# Patient Record
Sex: Female | Born: 1985 | Race: Black or African American | Hispanic: No | Smoking: Never smoker
Health system: Southern US, Community
[De-identification: ages and names within clinical notes are randomized; demographics above are authoritative.]

## PROBLEM LIST (undated history)

## (undated) DIAGNOSIS — I456 Pre-excitation syndrome: Secondary | ICD-10-CM

## (undated) DIAGNOSIS — G90A Postural orthostatic tachycardia syndrome (POTS): Secondary | ICD-10-CM

## (undated) DIAGNOSIS — I498 Other specified cardiac arrhythmias: Secondary | ICD-10-CM

## (undated) HISTORY — DX: Pre-excitation syndrome: I45.6

## (undated) HISTORY — DX: Other specified cardiac arrhythmias: I49.8

## (undated) HISTORY — DX: Postural orthostatic tachycardia syndrome (POTS): G90.A

---
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
## 2018-10-13 ENCOUNTER — Encounter: Payer: Self-pay | Admitting: *Deleted

## 2018-10-27 ENCOUNTER — Telehealth: Payer: Self-pay | Admitting: Obstetrics and Gynecology

## 2018-10-27 NOTE — Telephone Encounter (Signed)
Spoke with patient about her appointment on 9/28 @ 8:30. Patient instructed that the appointment is a telephone visit. Patient instructed to be available at her appointment time. Patient verbalized understanding.

## 2018-10-30 ENCOUNTER — Ambulatory Visit (INDEPENDENT_AMBULATORY_CARE_PROVIDER_SITE_OTHER): Payer: Self-pay | Admitting: *Deleted

## 2018-10-30 ENCOUNTER — Other Ambulatory Visit: Payer: Self-pay

## 2018-10-30 DIAGNOSIS — O9989 Other specified diseases and conditions complicating pregnancy, childbirth and the puerperium: Secondary | ICD-10-CM

## 2018-10-30 DIAGNOSIS — O0990 Supervision of high risk pregnancy, unspecified, unspecified trimester: Secondary | ICD-10-CM

## 2018-10-30 DIAGNOSIS — G90A Postural orthostatic tachycardia syndrome (POTS): Secondary | ICD-10-CM

## 2018-10-30 DIAGNOSIS — Z8659 Personal history of other mental and behavioral disorders: Secondary | ICD-10-CM | POA: Insufficient documentation

## 2018-10-30 DIAGNOSIS — I456 Pre-excitation syndrome: Secondary | ICD-10-CM | POA: Insufficient documentation

## 2018-10-30 HISTORY — DX: Supervision of high risk pregnancy, unspecified, unspecified trimester: O09.90

## 2018-10-30 NOTE — Progress Notes (Signed)
I connected with  [name] on 10/30/18 at  8:30 AM EDT by telephone and verified that I am speaking with the correct person using two identifiers.   I discussed the limitations, risks, security and privacy concerns of performing an evaluation and management service by telephone and the availability of in person appointments. I also discussed with the patient that there may be a patient responsible charge related to this service. The patient expressed understanding and agreed to proceed.  Explained I am completing her New OB Intake today. We discussed Her EDD and that it is based on  sure LMP . I reviewed her allergies, meds, OB History, Medical /Surgical history, and appropriate screenings. I explained we will send her [employer] app- app sent to her while on phone.  Explained we will send a blood pressure cuff to her once her medicaid is active- she is in process of applying- asked her to inform us when her medicaid is approved.  Explained  then we will have her take her blood pressure weekly and enter into the app. Explained she will have some visits in office and some virtually. She already has Community education officer. Reviewed appointment date/ time with her , our location and to wear mask, no visitors. Explained she will have exam, ob bloodwork, hemoglobin a1C, cbg , genetic testing if desired, pap if needed. I scheduled an [place] at 19 weeks and gave her the appointment.I explained we will change her new ob visit to be with MD instead of APP due to her history  And she will be notified.  She voices understanding.  Geoff Dacanay,RN 10/30/2018  8:31 AM

## 2018-10-31 ENCOUNTER — Telehealth: Payer: Self-pay

## 2018-11-23 ENCOUNTER — Encounter: Payer: Self-pay | Admitting: Obstetrics and Gynecology

## 2018-11-23 ENCOUNTER — Ambulatory Visit (INDEPENDENT_AMBULATORY_CARE_PROVIDER_SITE_OTHER): Payer: Medicaid Other | Admitting: Obstetrics & Gynecology

## 2018-11-23 ENCOUNTER — Other Ambulatory Visit: Payer: Self-pay

## 2018-11-23 ENCOUNTER — Encounter: Payer: Self-pay | Admitting: Obstetrics & Gynecology

## 2018-11-23 ENCOUNTER — Other Ambulatory Visit (HOSPITAL_COMMUNITY)
Admission: RE | Admit: 2018-11-23 | Discharge: 2018-11-23 | Disposition: A | Discharge status: Home / Self Care | Payer: Medicaid Other | Source: Ambulatory Visit | Attending: Obstetrics & Gynecology | Admitting: Obstetrics & Gynecology

## 2018-11-23 VITALS — BP 122/80 | HR 96 | Temp 98.3°F | Wt 201.7 lb

## 2018-11-23 DIAGNOSIS — O0911 Supervision of pregnancy with history of ectopic or molar pregnancy, first trimester: Secondary | ICD-10-CM

## 2018-11-23 DIAGNOSIS — O0990 Supervision of high risk pregnancy, unspecified, unspecified trimester: Secondary | ICD-10-CM

## 2018-11-23 DIAGNOSIS — A749 Chlamydial infection, unspecified: Secondary | ICD-10-CM

## 2018-11-23 DIAGNOSIS — Z98891 History of uterine scar from previous surgery: Secondary | ICD-10-CM

## 2018-11-23 DIAGNOSIS — Z3A12 12 weeks gestation of pregnancy: Secondary | ICD-10-CM

## 2018-11-23 DIAGNOSIS — O98812 Other maternal infectious and parasitic diseases complicating pregnancy, second trimester: Secondary | ICD-10-CM

## 2018-11-23 DIAGNOSIS — I456 Pre-excitation syndrome: Secondary | ICD-10-CM

## 2018-11-23 MED ORDER — BLOOD PRESSURE MONITORING DEVI: 1.0000 | 0 refills | Status: AC

## 2018-11-23 NOTE — Patient Instructions (Signed)

## 2018-11-23 NOTE — Progress Notes (Signed)
  Subjective:    Desiree Pearson is being seen today for her first obstetrical visit.  This is not a planned pregnancy. She is at [redacted]w[redacted]d gestation. Her obstetrical history is significant for SVT with WPW. Relationship with FOB: significant other, living together. Patient does intend to breast feed. Pregnancy history fully reviewed.  Patient reports Chest pain c/w SVT  Review of Systems:   Review of Systems  Constitutional: Negative.   HENT: Negative.   Respiratory: Negative.   Cardiovascular: Positive for chest pain.  Gastrointestinal: Negative.   Genitourinary: Negative.   Musculoskeletal: Negative.     Objective:     BP 122/80   Pulse 96   Temp 98.3 F (36.8 C)   Wt 201 lb 11.2 oz (91.5 kg)   LMP 08/25/2018 (Within Days)   BMI 36.89 kg/m  Physical Exam  Vitals reviewed. Constitutional: She is oriented to person, place, and time. She appears well-developed. No distress.  HENT:  Head: Normocephalic.  Eyes: Pupils are equal, round, and reactive to light.  Neck: Normal range of motion.  Cardiovascular: Normal rate, regular rhythm and normal heart sounds.  No murmur heard. Respiratory: Effort normal and breath sounds normal. No respiratory distress.  GI: Soft. She exhibits no mass.  Genitourinary:    Vagina normal.     No vaginal discharge.   Musculoskeletal: Normal range of motion.  Neurological: She is alert and oriented to person, place, and time.  Skin: Skin is warm and dry.  Psychiatric: She has a normal mood and affect. Her behavior is normal. Judgment and thought content normal.    Maternal Exam:  Introitus: Vagina is negative for discharge.       Assessment:    Pregnancy: G2P1 Patient Active Problem List   Diagnosis Date Noted  . History of postpartum depression, currently pregnant 10/30/2018  . Supervision of high risk pregnancy, antepartum 10/30/2018  . Wolff-Parkinson-White (WPW) syndrome   . Postural orthostatic tachycardia syndrome   . Depressive  disorder, not elsewhere classified 03/12/2009       Plan:     Initial labs drawn. Prenatal vitamins. Problem list reviewed and updated. Panorama sent Role of ultrasound in pregnancy discussed; fetal survey: ordered. Amniocentesis discussed: not indicated. Follow up in 4 weeks. 50% of 30 min visit spent on counseling and coordination of care.  Cardiology consult requested   Emeterio Reeve 11/23/2018

## 2018-11-24 LAB — OBSTETRIC PANEL, INCLUDING HIV
Antibody Screen: NEGATIVE
Basophils Absolute: 0 10*3/uL (ref 0.0–0.2)
Basos: 0 %
EOS (ABSOLUTE): 0.1 10*3/uL (ref 0.0–0.4)
Eos: 1 %
HIV Screen 4th Generation wRfx: NONREACTIVE
Hematocrit: 42.6 % (ref 34.0–46.6)
Hemoglobin: 12.7 g/dL (ref 11.1–15.9)
Hepatitis B Surface Ag: NEGATIVE
Immature Grans (Abs): 0.1 10*3/uL (ref 0.0–0.1)
Immature Granulocytes: 1 %
Lymphocytes Absolute: 1.6 10*3/uL (ref 0.7–3.1)
Lymphs: 23 %
MCH: 22.9 pg — ABNORMAL LOW (ref 26.6–33.0)
MCHC: 29.8 g/dL — ABNORMAL LOW (ref 31.5–35.7)
MCV: 77 fL — ABNORMAL LOW (ref 79–97)
Monocytes Absolute: 0.3 10*3/uL (ref 0.1–0.9)
Monocytes: 4 %
Neutrophils Absolute: 5 10*3/uL (ref 1.4–7.0)
Neutrophils: 71 %
Platelets: 157 10*3/uL (ref 150–450)
RBC: 5.54 x10E6/uL — ABNORMAL HIGH (ref 3.77–5.28)
RDW: 17.4 % — ABNORMAL HIGH (ref 11.7–15.4)
RPR Ser Ql: NONREACTIVE
Rh Factor: POSITIVE
Rubella Antibodies, IGG: 11.4 index (ref >0.99)
WBC: 7 10*3/uL (ref 3.4–10.8)

## 2018-11-24 LAB — HEMOGLOBIN A1C
Est. average glucose Bld gHb Est-mCnc: 120 mg/dL
Hgb A1c MFr Bld: 5.8 % — ABNORMAL HIGH (ref 4.8–5.6)

## 2018-11-25 LAB — CULTURE, OB URINE

## 2018-11-25 LAB — URINE CULTURE, OB REFLEX

## 2018-11-27 ENCOUNTER — Telehealth: Payer: Self-pay

## 2018-11-27 NOTE — Telephone Encounter (Addendum)
-----   Message from Woodroe Mode, MD sent at 11/27/2018 10:19 AM EDT ----- Needs an early 2 hr GTT, A1C was prediabetes range  Notified pt's results and the request for her to come in for a 2 hr testing.  I explained to the pt to not have anything to eat or drink starting midnight the night before and that she will be here for at least two hours.  Pt stated that she will be able to come in 11/30/18 for the 2hr gtt only.  [place] office notified.   Mel Almond, RN 11/27/18

## 2018-11-28 ENCOUNTER — Other Ambulatory Visit: Payer: Self-pay | Admitting: Lactation Services

## 2018-11-28 DIAGNOSIS — O0990 Supervision of high risk pregnancy, unspecified, unspecified trimester: Secondary | ICD-10-CM

## 2018-11-28 LAB — CYTOLOGY - PAP
Chlamydia: POSITIVE — AB
Comment: NEGATIVE
Comment: NEGATIVE
Comment: NORMAL
Diagnosis: NEGATIVE
High risk HPV: NEGATIVE
Neisseria Gonorrhea: NEGATIVE

## 2018-11-29 ENCOUNTER — Inpatient Hospital Stay (HOSPITAL_COMMUNITY)
Admission: AD | Admit: 2018-11-29 | Discharge: 2018-11-29 | Disposition: A | Discharge status: Home / Self Care | Payer: Medicaid Other | Attending: Family Medicine | Admitting: Family Medicine

## 2018-11-29 ENCOUNTER — Encounter: Payer: Self-pay | Admitting: *Deleted

## 2018-11-29 ENCOUNTER — Telehealth: Payer: Self-pay | Admitting: *Deleted

## 2018-11-29 ENCOUNTER — Encounter (HOSPITAL_COMMUNITY): Payer: Self-pay

## 2018-11-29 ENCOUNTER — Other Ambulatory Visit: Payer: Self-pay

## 2018-11-29 DIAGNOSIS — R109 Unspecified abdominal pain: Secondary | ICD-10-CM | POA: Diagnosis not present

## 2018-11-29 DIAGNOSIS — O26892 Other specified pregnancy related conditions, second trimester: Secondary | ICD-10-CM | POA: Insufficient documentation

## 2018-11-29 DIAGNOSIS — A749 Chlamydial infection, unspecified: Secondary | ICD-10-CM | POA: Insufficient documentation

## 2018-11-29 DIAGNOSIS — R1013 Epigastric pain: Secondary | ICD-10-CM

## 2018-11-29 DIAGNOSIS — Z8659 Personal history of other mental and behavioral disorders: Secondary | ICD-10-CM

## 2018-11-29 DIAGNOSIS — O0990 Supervision of high risk pregnancy, unspecified, unspecified trimester: Secondary | ICD-10-CM

## 2018-11-29 DIAGNOSIS — T887XXA Unspecified adverse effect of drug or medicament, initial encounter: Secondary | ICD-10-CM

## 2018-11-29 DIAGNOSIS — O26891 Other specified pregnancy related conditions, first trimester: Secondary | ICD-10-CM | POA: Diagnosis not present

## 2018-11-29 DIAGNOSIS — O98513 Other viral diseases complicating pregnancy, third trimester: Secondary | ICD-10-CM | POA: Diagnosis not present

## 2018-11-29 DIAGNOSIS — M549 Dorsalgia, unspecified: Secondary | ICD-10-CM | POA: Diagnosis not present

## 2018-11-29 DIAGNOSIS — Z79899 Other long term (current) drug therapy: Secondary | ICD-10-CM | POA: Insufficient documentation

## 2018-11-29 DIAGNOSIS — Z3A13 13 weeks gestation of pregnancy: Secondary | ICD-10-CM | POA: Insufficient documentation

## 2018-11-29 DIAGNOSIS — G90A Postural orthostatic tachycardia syndrome (POTS): Secondary | ICD-10-CM

## 2018-11-29 DIAGNOSIS — O98819 Other maternal infectious and parasitic diseases complicating pregnancy, unspecified trimester: Secondary | ICD-10-CM

## 2018-11-29 DIAGNOSIS — O99891 Other specified diseases and conditions complicating pregnancy: Secondary | ICD-10-CM

## 2018-11-29 HISTORY — DX: Other maternal infectious and parasitic diseases complicating pregnancy, unspecified trimester: A74.9

## 2018-11-29 LAB — URINALYSIS, ROUTINE W REFLEX MICROSCOPIC
Bilirubin Urine: NEGATIVE
Glucose, UA: NEGATIVE mg/dL
Hgb urine dipstick: NEGATIVE
Ketones, ur: NEGATIVE mg/dL
Nitrite: NEGATIVE
Protein, ur: NEGATIVE mg/dL
Specific Gravity, Urine: 1.011 (ref 1.005–1.030)
pH: 8 (ref 5.0–8.0)

## 2018-11-29 MED ORDER — ALUM & MAG HYDROXIDE-SIMETH 200-200-20 MG/5ML PO SUSP: 30.0000 mL | Freq: Once | ORAL | Status: DC

## 2018-11-29 MED ORDER — LIDOCAINE VISCOUS HCL 2 % MT SOLN
15.0000 mL | Freq: Once | OROMUCOSAL | Status: AC
  Administered 2018-11-29: 15 mL via ORAL
  Filled 2018-11-29: qty 15

## 2018-11-29 MED ORDER — FAMOTIDINE 20 MG PO TABS: 20.0000 mg | ORAL_TABLET | Freq: Two times a day (BID) | ORAL | 0 refills | Status: DC | PRN

## 2018-11-29 MED ORDER — ALUM & MAG HYDROXIDE-SIMETH 200-200-20 MG/5ML PO SUSP
30.0000 mL | Freq: Once | ORAL | Status: AC
  Administered 2018-11-29: 30 mL via ORAL
  Filled 2018-11-29: qty 30

## 2018-11-29 MED ORDER — AZITHROMYCIN 250 MG PO TABS: ORAL_TABLET | ORAL | 1 refills | Status: DC

## 2018-11-29 MED ORDER — DICYCLOMINE HCL 10 MG/5ML PO SOLN
10.0000 mg | Freq: Once | ORAL | Status: AC
  Administered 2018-11-29: 10 mg via ORAL
  Filled 2018-11-29: qty 5

## 2018-11-29 MED ORDER — FAMOTIDINE 20 MG PO TABS
20.0000 mg | ORAL_TABLET | Freq: Once | ORAL | Status: AC
  Administered 2018-11-29: 20 mg via ORAL
  Filled 2018-11-29: qty 1

## 2018-11-29 MED FILL — AZITHROMYCIN 250 MG TABLET: 250 | 1 days supply | Qty: 4 | Fill #0 | Status: TO

## 2018-11-29 NOTE — Telephone Encounter (Signed)
Per infectious disease report Desiree Pearson reported to test Positive for Chlamydia on her pap smear. I called Desiree Pearson and informed her of + chlamydia on her pap smear, and that her pap smear was negative. I explained this is a STD and she and her partner need to be treated and avoid sexual contact until both treated; then wait 2 weeks. I explained we will send RX for Zithromax to her pharmacy and we can send a refill for  Her partner if needed if he is not allergic to any meds. She confirmed her is not allergic and asked that I send in refill. I explained because she is pregnant we will do a TOC later to be sure infection cleared. She voices understanding. STD form completed and sent to [facility].  Forestine Macho,RN

## 2018-11-29 NOTE — MAU Provider Note (Addendum)
History   Patient Desiree Pearson is a 33 y.o. G2P1 at [redacted]w[redacted]d  Here today complaining of abdominal pain.  The patient began having abdominal pain around 6pm this evening after taking 1 gram of azithromycin orally for a chlamydia infection.  The patient said that the pain began suddenly shortly after taking the medication.  She denied nausea, vomiting, diarrhea, or vaginal bleeding.  CSN: [id number]  Arrival date and time: 11/29/18 [id number]   First Provider Initiated Contact with Patient 11/29/18 1936      Chief Complaint  Patient presents with  . Abdominal Pain  . Back Pain   Abdominal Pain This is a new problem. The current episode started today. The onset quality is sudden. The problem occurs constantly. The most recent episode lasted 2 hours. The problem has been unchanged. The pain is located in the epigastric region. The pain is at a severity of 10/10. The pain is severe. The quality of the pain is sharp. Pertinent negatives include no diarrhea, nausea or vomiting. Nothing aggravates the pain. The pain is relieved by nothing. She has tried nothing for the symptoms.    OB History    Gravida  2   Para  1   Term      Preterm      AB      Living  1     SAB      TAB      Ectopic      Multiple      Live Births  1           Past Medical History:  Diagnosis Date  . Postural orthostatic tachycardia syndrome   . Wolff-Parkinson-White (WPW) syndrome     Past Surgical History:  Procedure Laterality Date  . ABLATION  2006   for Wolf parkinson white disease  . CESAREAN SECTION      Family History  Problem Relation Age of Onset  . Diabetes Father   . Hypertension Father     Social History   Tobacco Use  . Smoking status: Never Smoker  . Smokeless tobacco: Never Used  Substance Use Topics  . Alcohol use: Not Currently    Comment: occasionally wine  . Drug use: Never    Allergies:  Allergies  Allergen Reactions  . Morphine Rash and Other (See Comments)     Heart rate dropped  . Sulfur Rash    Medications Prior to Admission  Medication Sig Dispense Refill Last Dose  . azithromycin (ZITHROMAX) 250 MG tablet Take all 4 tablets at once for at total of 1gram 4 each 1 11/29/2018 at Unknown time  . folic acid (FOLVITE) 1 MG tablet Take 1 mg by mouth daily.     . Prenatal Vit-Fe Fumarate-FA (PRENATAL VITAMINS PO) Take 1 tablet by mouth daily.   11/29/2018 at Unknown time  . Blood Pressure Monitoring DEVI 1 Device by Does not apply route once a week. 1 Device 0     Review of Systems  HENT: Positive for congestion.   Respiratory: Negative.   Cardiovascular: Negative for chest pain.  Gastrointestinal: Positive for abdominal pain. Negative for diarrhea, nausea and vomiting.  Genitourinary: Negative for vaginal bleeding.   Physical Exam   Blood pressure 128/87, pulse (!) 110, temperature 99.2 F (37.3 C), last menstrual period 08/25/2018, SpO2 100 %.  Physical Exam  Constitutional: She is oriented to person, place, and time.  HENT:  Head: Normocephalic and atraumatic.  Eyes: Pupils are equal, round, and reactive to light.  Neck: Normal range of motion.  Cardiovascular: Normal rate.  Respiratory: Effort normal.  GI: There is abdominal tenderness.  Genitourinary:    Genitourinary Comments: Deferred   Musculoskeletal: Normal range of motion.  Neurological: She is alert and oriented to person, place, and time.  Skin: Skin is warm and dry. She is not diaphoretic.  Psychiatric: Thought content normal.   FHR: 166 bpm MAU Course  Procedures  MDM GI cocktail and Bentyl  Assessment and Plan  Abdominal pain due to medication side effect.  Discharge home. Reassurance given. Return precautions given including vaginal bleeding and additional abdominal pain.  Dorothyann Peng 11/29/2018, 7:47 PM   I confirm that I have verified the information documented in the physician assistant student's note and that I have also personally  reperformed the history, physical exam and all medical decision making activities of this service and have verified that all service and findings are accurately documented in this student's note.   -Patient reassured that her baby is ok; patient thinks that her placenta is up under her ribs and I explained that her placenta is much lower, near her pubic bone.  -will order GI cocktail and Bentyl  -Patient care endorsed to Dr. Marice Potter, oncoming provider.   [place]arr [place], [place] 11/29/2018 8:03 PM   Assumed care as stated above. Patient reported mild improvement in abdominal pain but still with discomfort. Gave 20 mg Pepcid. Patient reported resolution in abdominal discomfort and felt comfortable discharging home. Pepcid prescribed as BID PRN on discharge.Patient verbalized understanding of discharge and follow-up instructions and was ambulating without assistance upon discharge.  Barrington Ellison, MD [facility] Family Medicine Fellow, [facility] for [employer], [place]

## 2018-11-29 NOTE — MAU Note (Signed)
Pt arrived via EMS for abdominal and back pain which started after taking antibiotics for chlamydia. Pt also says she has chills and a runny nose after leaving doctor yesterday. Temp 99.2  Occasional headaches.

## 2018-11-30 ENCOUNTER — Encounter: Payer: Self-pay | Admitting: Obstetrics & Gynecology

## 2018-11-30 ENCOUNTER — Other Ambulatory Visit: Payer: Medicaid Other

## 2018-11-30 MED FILL — AZITHROMYCIN 250 MG TABLET: 250 | 1 days supply | Qty: 4 | Fill #0

## 2018-12-01 ENCOUNTER — Telehealth: Payer: Self-pay | Admitting: Family Medicine

## 2018-12-01 LAB — URINE CULTURE

## 2018-12-01 NOTE — Telephone Encounter (Signed)
Called patient to get clarification on which office she will be getting ob care with because she is scheduled with [place] as well as [facility]. Patient stated that she is going to get care done with [facility] because she was not happy with the care that she received with [place] at this office. Apologized for her bad experience and her appointment was canceled with [place].

## 2018-12-04 ENCOUNTER — Telehealth: Payer: Self-pay | Admitting: Emergency Medicine

## 2018-12-04 NOTE — Telephone Encounter (Signed)
Pt called the nurse voicemail line requesting genetic screening results.   Per chart review, labs were done on 10/22 and results not available at this time.   Pt call returned and pt informed that results can take at least two weeks and at this time her results were not available. Pt verbalized understanding.

## 2018-12-05 ENCOUNTER — Encounter: Payer: Self-pay | Admitting: *Deleted

## 2018-12-07 ENCOUNTER — Encounter: Payer: Self-pay | Admitting: *Deleted

## 2018-12-22 ENCOUNTER — Telehealth: Payer: Self-pay | Admitting: General Practice

## 2018-12-22 ENCOUNTER — Encounter: Payer: Self-pay | Admitting: General Practice

## 2018-12-22 NOTE — Telephone Encounter (Signed)
Called patient regarding [facility] Results, no answer- left message to call back for results. Per chart review, patient has transferred to [facility] office.

## 2018-12-25 ENCOUNTER — Telehealth: Payer: Self-pay | Admitting: *Deleted

## 2018-12-25 NOTE — Telephone Encounter (Signed)
Received fax from [place] that a [place]  partner test is pending creation since patient has not provided partner information.  Requests we reach out to patient to submit partner information because they cannot create a test without information. Linda,RN

## 2019-01-04 ENCOUNTER — Telehealth (INDEPENDENT_AMBULATORY_CARE_PROVIDER_SITE_OTHER): Payer: BC Managed Care – PPO | Admitting: Obstetrics and Gynecology

## 2019-01-04 DIAGNOSIS — Z3A18 18 weeks gestation of pregnancy: Secondary | ICD-10-CM

## 2019-01-04 DIAGNOSIS — D563 Thalassemia minor: Secondary | ICD-10-CM | POA: Diagnosis not present

## 2019-01-04 DIAGNOSIS — I498 Other specified cardiac arrhythmias: Secondary | ICD-10-CM | POA: Diagnosis not present

## 2019-01-04 DIAGNOSIS — I456 Pre-excitation syndrome: Secondary | ICD-10-CM | POA: Diagnosis not present

## 2019-01-04 DIAGNOSIS — O99891 Other specified diseases and conditions complicating pregnancy: Secondary | ICD-10-CM

## 2019-01-04 DIAGNOSIS — O98812 Other maternal infectious and parasitic diseases complicating pregnancy, second trimester: Secondary | ICD-10-CM | POA: Diagnosis not present

## 2019-01-04 DIAGNOSIS — O0990 Supervision of high risk pregnancy, unspecified, unspecified trimester: Secondary | ICD-10-CM

## 2019-01-04 DIAGNOSIS — A749 Chlamydial infection, unspecified: Secondary | ICD-10-CM | POA: Diagnosis not present

## 2019-01-04 DIAGNOSIS — G90A Postural orthostatic tachycardia syndrome (POTS): Secondary | ICD-10-CM

## 2019-01-04 DIAGNOSIS — Z8659 Personal history of other mental and behavioral disorders: Secondary | ICD-10-CM

## 2019-01-04 DIAGNOSIS — O0992 Supervision of high risk pregnancy, unspecified, second trimester: Secondary | ICD-10-CM | POA: Diagnosis not present

## 2019-01-04 MED ORDER — MISC. DEVICES MISC: 0 refills | Status: DC

## 2019-01-04 NOTE — Progress Notes (Signed)
I connected with Desiree Pearson on 01/04/19 at 10:00 AM EST by telephone and verified that I am speaking with the correct person using two identifiers.   Pt c/o recurrent BV infections after IC.

## 2019-01-04 NOTE — Progress Notes (Signed)
   TELEHEALTH VIRTUAL OBSTETRICS VISIT ENCOUNTER NOTE  Provider location: Center for [employer] at [place]   I connected with [name] on 01/04/19 at 10:00 AM EST by telephone at home and verified that I am speaking with the correct person using two identifiers.  I discussed the limitations, risks, security and privacy concerns of performing an evaluation and management service by telephone and the availability of in person appointments. I also discussed with the patient that there may be a patient responsible charge related to this service. The patient expressed understanding and agreed to proceed.  Subjective:  Desiree Pearson is a 33 y.o. G2P1 at [redacted]w[redacted]d being followed for ongoing prenatal care.  She is currently monitored for the following issues for this high-risk pregnancy and has Depressive disorder, not elsewhere classified; History of postpartum depression, currently pregnant; Supervision of high risk pregnancy, antepartum; Wolff-Parkinson-White (WPW) syndrome; Postural orthostatic tachycardia syndrome; Chlamydia infection affecting pregnancy; and Thalassemia alpha carrier on their problem list.  Patient reports pelvic pain. Reports she is having Braxton-Hicks, feeling cramping off and on for the next few weeks. Also reports occasional pain in leg, has happened 3x in pregnancy. Reports aching in legs that sometimes is quite painful, has had to lay down when this stopped. Reports fetal movement. Denies any contractions, bleeding or leaking of fluid.   The following portions of the patient's history were reviewed and updated as appropriate: allergies, current medications, past family history, past medical history, past social history, past surgical history and problem list.   Objective:   General:  Alert, oriented and cooperative.   Mental Status: Normal mood and affect perceived. Normal judgment and thought content.  Rest of physical exam deferred due to type of encounter   Assessment and Plan:  Pregnancy: G2P1 at [redacted]w[redacted]d  1. Supervision of high risk pregnancy, antepartum Needs early 2 hr GTT Maternity belt prescribed  2. History of postpartum depression, currently pregnant  3. Chlamydia infection affecting pregnancy in second trimester Pt to come in for TOC  4. Wolff-Parkinson-White (WPW) syndrome Has episodes where she "gets out of rhythm", last episode was last week - just relaxes when this happens, does not have a cardiologist - Ambulatory referral to Cardiology  5. Postural orthostatic tachycardia syndrome  6. Thalassemia alpha carrier - AMB MFM GENETICS REFERRAL  Preterm labor symptoms and general obstetric precautions including but not limited to vaginal bleeding, contractions, leaking of fluid and fetal movement were reviewed in detail with the patient.  I discussed the assessment and treatment plan with the patient. The patient was provided an opportunity to ask questions and all were answered. The patient agreed with the plan and demonstrated an understanding of the instructions. The patient was advised to call back or seek an in-person office evaluation/go to MAU at [facility] for any urgent or concerning symptoms. Please refer to After Visit Summary for other counseling recommendations.   I provided 25 minutes of non-face-to-face time during this encounter.  Return in about 4 weeks (around 02/01/2019) for high OB, virtual.  Future Appointments  Date Time Provider [place]  01/08/2019 10:00 AM Edison [place] [facility]  01/08/2019 10:00 AM [facility] [place], [place] for [place], [place]

## 2019-01-08 ENCOUNTER — Ambulatory Visit (HOSPITAL_BASED_OUTPATIENT_CLINIC_OR_DEPARTMENT_OTHER): Payer: BC Managed Care – PPO | Admitting: Genetic Counselor

## 2019-01-08 ENCOUNTER — Other Ambulatory Visit: Payer: Self-pay

## 2019-01-08 ENCOUNTER — Ambulatory Visit (HOSPITAL_COMMUNITY)
Admission: RE | Admit: 2019-01-08 | Discharge: 2019-01-08 | Disposition: A | Discharge status: Home / Self Care | Payer: BC Managed Care – PPO | Source: Ambulatory Visit | Attending: Obstetrics and Gynecology | Admitting: Obstetrics and Gynecology

## 2019-01-08 ENCOUNTER — Ambulatory Visit (HOSPITAL_COMMUNITY): Payer: Self-pay | Admitting: Obstetrics and Gynecology

## 2019-01-08 ENCOUNTER — Other Ambulatory Visit (HOSPITAL_COMMUNITY): Payer: Self-pay | Admitting: *Deleted

## 2019-01-08 ENCOUNTER — Encounter (HOSPITAL_COMMUNITY): Payer: Self-pay

## 2019-01-08 ENCOUNTER — Ambulatory Visit (HOSPITAL_COMMUNITY): Payer: BC Managed Care – PPO | Admitting: *Deleted

## 2019-01-08 ENCOUNTER — Encounter: Payer: Medicaid Other | Admitting: Obstetrics & Gynecology

## 2019-01-08 DIAGNOSIS — Z148 Genetic carrier of other disease: Secondary | ICD-10-CM | POA: Diagnosis not present

## 2019-01-08 DIAGNOSIS — G90A Postural orthostatic tachycardia syndrome (POTS): Secondary | ICD-10-CM

## 2019-01-08 DIAGNOSIS — Z8659 Personal history of other mental and behavioral disorders: Secondary | ICD-10-CM | POA: Diagnosis not present

## 2019-01-08 DIAGNOSIS — O99891 Other specified diseases and conditions complicating pregnancy: Secondary | ICD-10-CM | POA: Diagnosis not present

## 2019-01-08 DIAGNOSIS — I498 Other specified cardiac arrhythmias: Secondary | ICD-10-CM | POA: Diagnosis not present

## 2019-01-08 DIAGNOSIS — O0990 Supervision of high risk pregnancy, unspecified, unspecified trimester: Secondary | ICD-10-CM

## 2019-01-08 DIAGNOSIS — Z3A19 19 weeks gestation of pregnancy: Secondary | ICD-10-CM

## 2019-01-08 DIAGNOSIS — O2692 Pregnancy related conditions, unspecified, second trimester: Secondary | ICD-10-CM

## 2019-01-08 DIAGNOSIS — Z362 Encounter for other antenatal screening follow-up: Secondary | ICD-10-CM

## 2019-01-08 DIAGNOSIS — D563 Thalassemia minor: Secondary | ICD-10-CM | POA: Insufficient documentation

## 2019-01-08 DIAGNOSIS — Z8279 Family history of other congenital malformations, deformations and chromosomal abnormalities: Secondary | ICD-10-CM | POA: Diagnosis not present

## 2019-01-08 DIAGNOSIS — I456 Pre-excitation syndrome: Secondary | ICD-10-CM | POA: Insufficient documentation

## 2019-01-08 DIAGNOSIS — O99212 Obesity complicating pregnancy, second trimester: Secondary | ICD-10-CM

## 2019-01-08 DIAGNOSIS — Z315 Encounter for genetic counseling: Secondary | ICD-10-CM | POA: Diagnosis not present

## 2019-01-08 NOTE — Progress Notes (Signed)
01/08/2019  Desiree Pearson 1986/01/27 MRN: [id number] DOV: 01/08/2019  Desiree Pearson presented to the [facility] for a genetics consultation regarding her carrier status for alpha-thalassemia. Desiree Pearson came to her appointment alone due to COVID-19 visitor restrictions.   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 year old female. Her current pregnancy has completed [redacted]w[redacted]d (Estimated Date of Delivery: 06/01/19).  [name] denied exposure to environmental toxins or chemical agents. She denied the use of alcohol, tobacco or street drugs. She reported taking prenatal vitamins. She denied significant viral illnesses, fevers, and bleeding during the course of her pregnancy. Her medical and surgical histories were noncontributory.  Family History  A three generation pedigree was drafted and reviewed. The family history is remarkable for the following:  - Desiree Pearson has a personal history of Wolff-Parkinson-White (WPW) syndrome treated via ablation. WPW syndrome is a condition characterized by abnormal electrical pathways in the heart that cause a disruption of the heart's normal rhythm (arrhythmia). In most cases, the cause of WPW syndrome is unknown; however, a small percentage of cases can be caused by mutations in the PRKAG2 gene. Most cases of WPW syndrome occur sporadically in individuals with no family history of the disease. These cases are not inherited. However, a small percentage of cases are familial and can be inherited in an autosomal dominant pattern. Given that no one else in [name]'s family has WPW syndrome, hers is likely not inherited and there is likely a low risk of recurrence for her children. However, given that a genetic abnormality has not been ruled out, her children could have up to a 50% risk of inheriting WPW syndrome themselves.   - Desiree Pearson's daughter from  a prior relationship and Desiree Pearson's half sister were both born with "holes in their heart". The exact etiology of this CHD is unknown. There are multifactorial causes for isolated CHDs, including environmental and genetic factors. As such, the recurrence risk for family members of individuals with a CHD is elevated over the general population incidence of 1/200 (0.5%). We discussed that without knowing the exact etiology, the risk for a second-degree relative such as a half sibling is approximately 1%. A fetal echocardiogram to assess for CHDs in the current pregnancy is recommended.  The remaining family histories were reviewed and found to be noncontributory for birth defects, intellectual disability, recurrent pregnancy loss, and known genetic conditions. Desiree Pearson had limited information about her family history due to a complicated social situation and also had limited information about her partner's family history; thus, risk assessment was limited.  The patient's ethnicity is African American. The father of the pregnancy's ethnicity is African American. Ashkenazi Jewish ancestry and consanguinity were denied. Pedigree will be scanned under Media.  Discussion  Desiree Pearson had Horizon-14 carrier screening performed through [place]. 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 Alpha-thalassemia is different in its inheritance compared to other hemoglobinopathies as there are two copies of two alpha globin genes (HBA1 and HBA2) on each chromosome 16, or four alpha globin genes total (aa/aa). A person can be a carrier of one alpha gene mutation (aa/a-), also referred to as a "silent carrier". A person who carries two alpha globin gene mutations can either carry them in cis (both on the same chromosome, denoted as aa/--) or in trans (on different chromosomes, denoted as  a-/a-). Alpha-thalassemia carriers of two mutations who have African American ancestry  are more likely to have a trans arrangement (a-/a-); cis configuration is reported to be rare in individuals with African American ancestry.     There are several different forms of alpha-thalassemia. The most severe form of alpha-thalassemia, Hb Barts, is associated with an absence of alpha globin chain synthesis as a result of deletions of all four alpha globin genes (--/--).  Given that Desiree Pearson is a carrier in trans (a-/a-), her pregnancies would not be at increased risk for Hb Barts, even if her partner is a carrier for alpha-thalassemia, as she will always pass on at least one copy of the alpha globin gene to her children. Hemoglobin H (HbH) disease is caused by three deleted or dysfunctioning alpha globin alleles (a-/--) and is characterized by microcytic hypochromic hemolytic anemia, hepatosplenomegaly, mild jaundice, growth retardation, and sometimes thalassemia-like bone changes. Given Desiree Pearson's carrier status (a-/a-), the current fetus would only be at risk for HbH disease (a-/--), if her partner is a carrier for two alpha globin mutations in cis (aa/--). If this is the case, the risk for HbH disease in the pregnancy would be 1 in 2 (50%). However, if Desiree Pearson's partner is a carrier for two alpha globin mutations, he would be more likely to carry them in trans configuration (a-/a-) than the cis configuration (aa/--), given his ethnicity. If he is a carrier of alpha-thalassemia in trans, then the pregnancy would not be at increased risk for HbH disease. Based on the carrier frequency for alpha-thalassemia in the African American population, Desiree Pearson's partner has a 1 in 30 chance of being any type of carrier for alpha-thalassemia.   Desiree Pearson carrier screening was negative for the other 13 conditions screened. Thus, her risk to be a carrier for these additional conditions (listed separately in the laboratory report) has been reduced but not eliminated. This also significantly reduces her  risk of having a child affected by one of these conditions. We discussed that carrier testing for alpha-thalassemia is recommended for Ms. Hundal's partner. Ms. Raczkowski indicated that her partner likely will not be interested in pursuing carrier screening.  We also reviewed that Ms. Bartnick had Panorama NIPS through the laboratory [facility] that was low-risk for fetal aneuploidies. We reviewed that these results showed a less than 1 in 10,000 risk for trisomies 21, 18 and 13, and monosomy X (Turner syndrome).  In addition, the risk for triploidy and sex chromosome trisomies (47,XXX and 47,XXY) was also low. Ms. Heise elected to have cfDNA analysis for 22q11.2 deletion syndrome, which was also low risk (1 in 2900). We reviewed that while this testing identifies 94-99% of pregnancies with trisomy 87, trisomy 52, sex chromosome aneuploidies, and triploidy, it is NOT diagnostic. A positive test result requires confirmation by CVS or amniocentesis, and a negative test result does not rule out a fetal chromosome abnormality. She also understands that this testing does not identify all genetic conditions.  A complete ultrasound was performed today prior to our visit. The ultrasound report will be sent under separate cover. There were no visualized fetal anomalies or markers suggestive of aneuploidy. However, views of fetal anatomy were limited.  Ms. Czerwonka was also counseled regarding diagnostic testing via amniocentesis. We discussed the technical aspects of the procedure and quoted up to a 1 in 500 (0.2%) risk for spontaneous pregnancy loss or other adverse pregnancy outcomes as a result of amniocentesis. Cultured cells from an amniocentesis sample allow  for the visualization of a fetal karyotype, which can detect >99% of chromosomal aberrations. Chromosomal microarray can also be performed to identify smaller deletions or duplications of fetal chromosomal material. Amniocentesis could also be performed to assess  whether the baby is affected by alpha-thalassemia. After careful consideration, Ms. Milo declined amniocentesis at this time. She understands that amniocentesis is available at any point after 16 weeks of pregnancy and that she may opt to undergo the procedure at a later date should she change her mind.  Lastly, screening for open neural tube defects (ONTDs) via MS-AFP in the second trimester in addition to level II ultrasound examination is recommended. Ms. Burningham level II ultrasound did not detect any ONTDs today, though she will return for follow-up views of fetal anatomy in four weeks. Level II ultrasound is able to detect ONTDs with 90-95% sensitivity. However, normal results from any of the above options do not guarantee a normal baby, as 3-5% of newborns have some type of birth defect, many of which are not prenatally diagnosable.  Additional screening and diagnostic testing were declined today. I encouraged Ms. Dales to discuss carrier screening with her partner and contact me if he changes his mind about carrier screening. If he decides to pursue testing, I can facilitate sample collection and testing from there.   I counseled Ms. Rathel regarding the above risks and available options. The approximate face-to-face time with the genetic counselor was 25 minutes.  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 She does not think her partner will undergo testing. She will contact me to facilitate testing should he change his mind  Reviewed low-risk NIPS results  Reduction in risk for Down syndrome, trisomy 81, trisomy 52, sex chromosome aneuploidies, and 22q11.2 deletion syndrome  Reviewed results of ultrasound  No fetal anomalies or markers seen  Reduction in risk for fetal aneuploidy  Offered additional testing and screening  Declined  amniocentesis  Recommend MS-AFP screening  Reviewed family history concerns  Recommend fetal echocardiogram given family history of congenital heart defects    Buelah Manis, MS Genetic Counselor

## 2019-01-09 ENCOUNTER — Other Ambulatory Visit: Payer: Medicaid Other

## 2019-01-09 ENCOUNTER — Other Ambulatory Visit (HOSPITAL_COMMUNITY)
Admission: RE | Admit: 2019-01-09 | Discharge: 2019-01-09 | Disposition: A | Discharge status: Home / Self Care | Payer: BC Managed Care – PPO | Source: Ambulatory Visit | Attending: Obstetrics and Gynecology | Admitting: Obstetrics and Gynecology

## 2019-01-09 ENCOUNTER — Ambulatory Visit (INDEPENDENT_AMBULATORY_CARE_PROVIDER_SITE_OTHER): Payer: Medicaid Other

## 2019-01-09 DIAGNOSIS — O98812 Other maternal infectious and parasitic diseases complicating pregnancy, second trimester: Secondary | ICD-10-CM

## 2019-01-09 DIAGNOSIS — Z3402 Encounter for supervision of normal first pregnancy, second trimester: Secondary | ICD-10-CM

## 2019-01-09 DIAGNOSIS — A749 Chlamydial infection, unspecified: Secondary | ICD-10-CM | POA: Insufficient documentation

## 2019-01-09 NOTE — Progress Notes (Signed)
Pt is here today for self-swab, TOC Chlamydia.

## 2019-01-10 ENCOUNTER — Other Ambulatory Visit: Payer: Self-pay

## 2019-01-10 DIAGNOSIS — O99891 Other specified diseases and conditions complicating pregnancy: Secondary | ICD-10-CM

## 2019-01-10 DIAGNOSIS — M549 Dorsalgia, unspecified: Secondary | ICD-10-CM

## 2019-01-10 DIAGNOSIS — N760 Acute vaginitis: Secondary | ICD-10-CM

## 2019-01-10 LAB — GLUCOSE TOLERANCE, 2 HOURS W/ 1HR
Glucose, 1 hour: 138 mg/dL (ref 65–179)
Glucose, 2 hour: 86 mg/dL (ref 65–152)
Glucose, Fasting: 76 mg/dL (ref 65–91)

## 2019-01-10 LAB — CBC
Hematocrit: 37.5 % (ref 34.0–46.6)
Hemoglobin: 11.5 g/dL (ref 11.1–15.9)
MCH: 22.9 pg — ABNORMAL LOW (ref 26.6–33.0)
MCHC: 30.7 g/dL — ABNORMAL LOW (ref 31.5–35.7)
MCV: 75 fL — ABNORMAL LOW (ref 79–97)
Platelets: 158 10*3/uL (ref 150–450)
RBC: 5.02 x10E6/uL (ref 3.77–5.28)
RDW: 16.6 % — ABNORMAL HIGH (ref 11.7–15.4)
WBC: 5.9 10*3/uL (ref 3.4–10.8)

## 2019-01-10 LAB — RPR: RPR Ser Ql: NONREACTIVE

## 2019-01-10 LAB — CERVICOVAGINAL ANCILLARY ONLY
Chlamydia: NEGATIVE
Comment: NEGATIVE
Comment: NORMAL
Neisseria Gonorrhea: NEGATIVE

## 2019-01-10 LAB — HIV ANTIBODY (ROUTINE TESTING W REFLEX): HIV Screen 4th Generation wRfx: NONREACTIVE

## 2019-01-10 MED ORDER — TERCONAZOLE 0.8 % VA CREA: 1.0000 | TOPICAL_CREAM | Freq: Every day | VAGINAL | 0 refills | Status: DC

## 2019-01-10 MED ORDER — MISC. DEVICES MISC: 0 refills | Status: DC

## 2019-01-10 NOTE — Progress Notes (Signed)
Terconazole ordered for pt per protocol for vaginal itching and discharge. Maternity support belt also ordered for patient. Pt made aware.

## 2019-01-12 NOTE — Progress Notes (Signed)
Desiree Lo MD Reason for referral-history of WPW and POTS  HPI: 33 year old female for evaluation of WPW and POTS at request of Desiree Rota, MD. Patient has had previous ablation in 2006.  I do not have those records available.  She notes some dyspnea on exertion.  She has palpitations described as heart racing intermittently.  This is typically twice monthly.  There is associated dizziness but no syncope.  She has occasional chest pain for less than 1 minute.  Increases with inspiration and described as a sharp pain.  She has had this intermittently since prior to her ablation.  Because of the above cardiology asked to evaluate.  Current Outpatient Medications  Medication Sig Dispense Refill  . Blood Pressure Monitoring DEVI 1 Device by Does not apply route once a week. 1 Device 0  . folic acid (FOLVITE) 1 MG tablet Take 1 mg by mouth daily.    . Misc. Devices MISC Dispense one maternity belt for patient 1 each 0  . Prenatal Vit-Fe Fumarate-FA (PRENATAL VITAMINS PO) Take 1 tablet by mouth daily.     No current facility-administered medications for this visit.    Allergies  Allergen Reactions  . Morphine Rash and Other (See Comments)    Heart rate dropped  . Sulfur Rash     Past Medical History:  Diagnosis Date  . Postural orthostatic tachycardia syndrome   . Wolff-Parkinson-White (WPW) syndrome     Past Surgical History:  Procedure Laterality Date  . ABLATION  2006   for Wolf parkinson white disease  . CESAREAN SECTION      Social History   Socioeconomic History  . Marital status: Legally Separated    Spouse name: Not on file  . Number of children: 1  . Years of education: Not on file  . Highest education level: Not on file  Occupational History  . Not on file  Tobacco Use  . Smoking status: Never Smoker  . Smokeless tobacco: Never Used  Substance and Sexual Activity  . Alcohol use: Not Currently    Comment: occasionally wine  . Drug use: Never    . Sexual activity: Yes    Birth control/protection: None  Other Topics Concern  . Not on file  Social History Narrative  . Not on file   Social Determinants of Health   Financial Resource Strain:   . Difficulty of Paying Living Expenses: Not on file  Food Insecurity: No Food Insecurity  . Worried About Charity fundraiser in the Last Year: Never true  . Ran Out of Food in the Last Year: Never true  Transportation Needs: No Transportation Needs  . Lack of Transportation (Medical): No  . Lack of Transportation (Non-Medical): No  Physical Activity:   . Days of Exercise per Week: Not on file  . Minutes of Exercise per Session: Not on file  Stress:   . Feeling of Stress : Not on file  Social Connections:   . Frequency of Communication with Friends and Family: Not on file  . Frequency of Social Gatherings with Friends and Family: Not on file  . Attends Religious Services: Not on file  . Active Member of Clubs or Organizations: Not on file  . Attends Archivist Meetings: Not on file  . Marital Status: Not on file  Intimate Partner Violence:   . Fear of Current or Ex-Partner: Not on file  . Emotionally Abused: Not on file  . Physically Abused: Not on file  .  Sexually Abused: Not on file    Family History  Problem Relation Age of Onset  . Diabetes Father   . Hypertension Father     ROS: no fevers or chills, productive cough, hemoptysis, dysphasia, odynophagia, melena, hematochezia, dysuria, hematuria, rash, seizure activity, orthopnea, PND, pedal edema, claudication. Remaining systems are negative.  Physical Exam:   Blood pressure 112/82, pulse (!) 119, height 5\' 2"  (1.575 m), weight 203 lb 6.4 oz (92.3 kg), last menstrual period 08/25/2018.  General:  Well developed/well nourished in NAD Skin warm/dry Patient not depressed No peripheral clubbing Back-normal HEENT-normal/normal eyelids Neck supple/normal carotid upstroke bilaterally; no bruits; no JVD; no  thyromegaly chest - CTA/ normal expansion CV - RRR/normal S1 and S2; no murmurs, rubs or gallops;  PMI nondisplaced Abdomen -NT/ND, no HSM, 56-month intrauterine pregnancy 2+ femoral pulses, no bruits Ext-no edema, chords, 2+ DP Neuro-grossly nonfocal  ECG -sinus tachycardia at a rate at 119, no ST changes.  Personally reviewed  A/P  1 palpitations-etiology unclear.  She apparently has worn monitors previously that have been unremarkable.  She does have a history of supraventricular tachycardia status post ablation.  I will arrange a monitor to further assess.  We can consider referral to electrophysiology following delivery if her symptoms persists and SVT documented.  We will arrange echocardiogram to assess LV function.  2 WPW-status post prior ablation.  3 chest pain-symptoms are atypical.  Electrocardiogram shows no ST changes.  Can consider cardiac CTA in the future when she delivers.  4 history of pots  Desiree Ruths, MD

## 2019-01-18 ENCOUNTER — Ambulatory Visit (INDEPENDENT_AMBULATORY_CARE_PROVIDER_SITE_OTHER): Payer: BC Managed Care – PPO | Admitting: Cardiology

## 2019-01-18 ENCOUNTER — Inpatient Hospital Stay (HOSPITAL_COMMUNITY)
Admission: AD | Admit: 2019-01-18 | Discharge: 2019-01-18 | Disposition: A | Discharge status: Home / Self Care | Payer: BC Managed Care – PPO | Attending: Obstetrics & Gynecology | Admitting: Obstetrics & Gynecology

## 2019-01-18 ENCOUNTER — Other Ambulatory Visit: Payer: Self-pay

## 2019-01-18 ENCOUNTER — Encounter: Payer: Self-pay | Admitting: Cardiology

## 2019-01-18 ENCOUNTER — Encounter (HOSPITAL_COMMUNITY): Payer: Self-pay | Admitting: Obstetrics & Gynecology

## 2019-01-18 VITALS — BP 112/82 | HR 119 | Ht 62.0 in | Wt 203.4 lb

## 2019-01-18 DIAGNOSIS — R102 Pelvic and perineal pain unspecified side: Secondary | ICD-10-CM

## 2019-01-18 DIAGNOSIS — R002 Palpitations: Secondary | ICD-10-CM

## 2019-01-18 DIAGNOSIS — R309 Painful micturition, unspecified: Secondary | ICD-10-CM | POA: Diagnosis not present

## 2019-01-18 DIAGNOSIS — I498 Other specified cardiac arrhythmias: Secondary | ICD-10-CM

## 2019-01-18 DIAGNOSIS — R072 Precordial pain: Secondary | ICD-10-CM

## 2019-01-18 DIAGNOSIS — O26899 Other specified pregnancy related conditions, unspecified trimester: Secondary | ICD-10-CM

## 2019-01-18 DIAGNOSIS — Z3A20 20 weeks gestation of pregnancy: Secondary | ICD-10-CM | POA: Diagnosis not present

## 2019-01-18 DIAGNOSIS — G90A Postural orthostatic tachycardia syndrome (POTS): Secondary | ICD-10-CM

## 2019-01-18 DIAGNOSIS — I456 Pre-excitation syndrome: Secondary | ICD-10-CM

## 2019-01-18 DIAGNOSIS — O26892 Other specified pregnancy related conditions, second trimester: Secondary | ICD-10-CM

## 2019-01-18 LAB — URINALYSIS, ROUTINE W REFLEX MICROSCOPIC
Bilirubin Urine: NEGATIVE
Glucose, UA: NEGATIVE mg/dL
Hgb urine dipstick: NEGATIVE
Ketones, ur: NEGATIVE mg/dL
Nitrite: NEGATIVE
Protein, ur: NEGATIVE mg/dL
Specific Gravity, Urine: 1.01 (ref 1.005–1.030)
pH: 7 (ref 5.0–8.0)

## 2019-01-18 NOTE — MAU Note (Signed)
.   Desiree Pearson is a 33 y.o. at [redacted]w[redacted]d here in MAU reporting:pelvic pressure since yesterday. States she feels more pressure when she voids. Denies any VB  Onset of complaint: yesterday Pain score: 10 Vitals:   01/18/19 1445 01/18/19 1448  BP:  123/76  Pulse: 100 92  Resp: 18 16  Temp: 98.1 F (36.7 C) 98 F (36.7 C)  SpO2: 100%      FHT:135 Lab orders placed from triage: UA

## 2019-01-18 NOTE — MAU Provider Note (Signed)
CC:  Chief Complaint  Patient presents with  . vaginal pressure      First Provider Initiated Contact with Patient 01/18/19 2201     HPI: Desiree Pearson is a 33 y.o. year old G11P1 female at [redacted]w[redacted]d weeks gestation who presents to MAU reporting pelvic pressure since yesterday that is worse when she voids.    Associated Sx: Neg for hematuria, urgency, frequency, fever, chills, GI complaints, vaginal discharge  Vaginal bleeding: denies Leaking of fluid: denies Fetal movement: Active  O:  Patient Vitals for the past 24 hrs:  BP Temp Pulse Resp SpO2 Weight  01/18/19 1448 123/76 98 F (36.7 C) 92 16 -- 92 kg  01/18/19 1445 -- 98.1 F (36.7 C) 100 18 100 % --    General: NAD Heart: Regular rate Lungs: Normal rate and effort Abd: Soft, NT, Gravid, S=D Pelvic: NEFG, neg LOF, neg  blood.  Dilation: Closed Effacement (%): Thick Exam by:: Marlou Porch, CNM  FHR 135 by doppler  Results for orders placed or performed during the hospital encounter of 01/18/19 (from the past 24 hour(s))  Urinalysis, Routine w reflex microscopic     Status: Abnormal   Collection Time: 01/18/19  3:10 PM  Result Value Ref Range   Color, Urine YELLOW YELLOW   APPearance HAZY (A) CLEAR   Specific Gravity, Urine 1.010 1.005 - 1.030   pH 7.0 5.0 - 8.0   Glucose, UA NEGATIVE NEGATIVE mg/dL   Hgb urine dipstick NEGATIVE NEGATIVE   Bilirubin Urine NEGATIVE NEGATIVE   Ketones, ur NEGATIVE NEGATIVE mg/dL   Protein, ur NEGATIVE NEGATIVE mg/dL   Nitrite NEGATIVE NEGATIVE   Leukocytes,Ua MODERATE (A) NEGATIVE   RBC / HPF 0-5 0 - 5 RBC/hpf   WBC, UA 6-10 0 - 5 WBC/hpf   Bacteria, UA RARE (A) NONE SEEN   Squamous Epithelial / LPF 6-10 0 - 5     Orders Placed This Encounter  Procedures  . Culture, OB Urine  . Urinalysis, Routine w reflex microscopic   MAU course/MDM Pelvic pressure consistent with round ligament pain. UA + Leuks, otherwise neg. Not clearly indicative of UTI.  Urine sent for culture.  Offered Rx  antibiotic now or wait for urine culture result.  Patient prefers to wait for urine culture result.  No evidence of active preterm labor.  A: [redacted]w[redacted]d week IUP 1. Pelvic pain affecting pregnancy in second trimester, antepartum   2. Pain of round ligament affecting pregnancy, antepartum   3. Painful urination    P: Discharge home in stable condition Preterm labor precautions and fetal kick counts. Increase fluids. Tylenol as needed. Follow-up as scheduled for prenatal visit or sooner as needed if symptoms worsen. Return to maternity admissions as needed if symptoms worsen.  Tamala Julian, [place], [place] 01/18/2019 5:46 PM  3

## 2019-01-18 NOTE — Discharge Instructions (Signed)
Abdominal Pain During Pregnancy  Abdominal pain is common during pregnancy, and has many possible causes. Some causes are more serious than others, and sometimes the cause is not known. Abdominal pain can be a sign that labor is starting. It can also be caused by normal growth and stretching of muscles and ligaments during pregnancy. Always tell your health care provider if you have any abdominal pain. Follow these instructions at home:  Do not have sex or put anything in your vagina until your pain goes away completely.  Get plenty of rest until your pain improves.  Drink enough fluid to keep your urine pale yellow.  Take over-the-counter and prescription medicines only as told by your health care provider.  Keep all follow-up visits as told by your health care provider. This is important. Contact a health care provider if:  Your pain continues or gets worse after resting.  You have lower abdominal pain that: ? Comes and goes at regular intervals. ? Spreads to your back. ? Is similar to menstrual cramps.  You have pain or burning when you urinate. Get help right away if:  You have a fever or chills.  You have vaginal bleeding.  You are leaking fluid from your vagina.  You are passing tissue from your vagina.  You have vomiting or diarrhea that lasts for more than 24 hours.  Your baby is moving less than usual.  You feel very weak or faint.  You have shortness of breath.  You develop severe pain in your upper abdomen. Summary  Abdominal pain is common during pregnancy, and has many possible causes.  If you experience abdominal pain during pregnancy, tell your health care provider right away.  Follow your health care provider's home care instructions and keep all follow-up visits as directed. This information is not intended to replace advice given to you by your health care provider. Make sure you discuss any questions you have with your health care  provider. Document Released: 01/18/2005 Document Revised: 05/08/2018 Document Reviewed: 04/22/2016 Elsevier Patient Education  [place].  Round Ligament Pain During Pregnancy   Round ligament pain is a sharp pain or jabbing feeling often felt in the lower belly or groin area on one or both sides. It is one of the most common complaints during pregnancy and is considered a normal part of pregnancy. It is most often felt during the second trimester.   Here is what you need to know about round ligament pain, including some tips to help you feel better.   Causes of Round Ligament Pain   Several thick ligaments surround and support your womb (uterus) as it grows during pregnancy. One of them is called the round ligament.   The round ligament connects the front part of the womb to your groin, the area where your legs attach to your pelvis. The round ligament normally tightens and relaxes slowly.   As your baby and womb grow, the round ligament stretches. That makes it more likely to become strained.   Sudden movements can cause the ligament to tighten quickly, like a rubber band snapping. This causes a sudden and quick jabbing feeling.   Symptoms of Round Ligament Pain   Round ligament pain can be concerning and uncomfortable. But it is considered normal as your body changes during pregnancy.   The symptoms of round ligament pain include a sharp, sudden spasm in the belly. It usually affects the right side, but it may happen on both sides. The pain only  lasts a few seconds.   Exercise may cause the pain, as will rapid movements such as:  sneezing  coughing  laughing  rolling over in bed  standing up too quickly   Treatment of Round Ligament Pain   Here are some tips that may help reduce your discomfort:   Pain relief. Take over-the-counter acetaminophen for pain, if necessary. Ask your doctor if this is OK.   Exercise. Get plenty of exercise to keep your stomach (core)  muscles strong. Doing stretching exercises or prenatal yoga can be helpful. Ask your doctor which exercises are safe for you and your baby.   A helpful exercise involves putting your hands and knees on the floor, lowering your head, and pushing your backside into the air.   Avoid sudden movements. Change positions slowly (such as standing up or sitting down) to avoid sudden movements that may cause stretching and pain.   Flex your hips. Bend and flex your hips before you cough, sneeze, or laugh to avoid pulling on the ligaments.   Apply warmth. A heating pad or warm bath may be helpful. Ask your doctor if this is OK. Extreme heat can be dangerous to the baby.   You should try to modify your daily activity level and avoid positions that may worsen the condition.   When to Call the Doctor/Midwife   Always tell your doctor or midwife about any type of pain you have during pregnancy. Round ligament pain is quick and doesn't last long.   Call your health care provider immediately if you have:  severe pain  fever  chills  pain on urination  difficulty walking   Belly pain during pregnancy can be due to many different causes. It is important for your doctor to rule out more serious conditions, including pregnancy complications such as placenta abruption or non-pregnancy illnesses such as:  inguinal hernia  appendicitis  stomach, liver, and kidney problems  Preterm labor pains may sometimes be mistaken for round ligament pain.

## 2019-01-18 NOTE — Patient Instructions (Signed)
Medication Instructions:  NO CHANGE *If you need a refill on your cardiac medications before your next appointment, please call your pharmacy*  Lab Work: If you have labs (blood work) drawn today and your tests are completely normal, you will receive your results only by: Marland Kitchen MyChart Message (if you have MyChart) OR . A paper copy in the mail If you have any lab test that is abnormal or we need to change your treatment, we will call you to review the results.  Testing/Procedures: Your physician has requested that you have an echocardiogram. Echocardiography is a painless test that uses sound waves to create images of your heart. It provides your doctor with information about the size and shape of your heart and how well your heart's chambers and valves are working. This procedure takes approximately one hour. There are no restrictions for this procedure.[place] has recommended that you wear a 30 DAY event monitor. Event monitors are medical devices that record the heart's electrical activity. Doctors most often [place] these monitors to diagnose arrhythmias. Arrhythmias are problems with the speed or rhythm of the heartbeat. The monitor is a small, portable device. You can wear one while you do your normal daily activities. This is usually used to diagnose what is causing palpitations/syncope (passing out).      Follow-Up: At [facility], you and your health needs are our priority.  As part of our continuing mission to provide you with exceptional heart care, we have created designated Provider Care Teams.  These Care Teams include your primary Cardiologist (physician) and Advanced Practice Providers (APPs -  Physician Assistants and Nurse Practitioners) who all work together to provide you with the care you need, when you need it.  Your next appointment:   6 month(s)  The format for your next appointment:   Either In Person or Virtual  Provider:   You may see  [name] MD or one of the following Advanced Practice Providers on your designated Care Team:    Kerin Ransom, PA-C  [place], [place]  Coletta Memos, [place]

## 2019-01-20 LAB — CULTURE, OB URINE: Culture: 30000 — AB

## 2019-01-22 ENCOUNTER — Other Ambulatory Visit: Payer: Self-pay | Admitting: Advanced Practice Midwife

## 2019-01-22 DIAGNOSIS — R300 Dysuria: Secondary | ICD-10-CM

## 2019-01-22 DIAGNOSIS — O26892 Other specified pregnancy related conditions, second trimester: Secondary | ICD-10-CM

## 2019-01-23 ENCOUNTER — Other Ambulatory Visit: Payer: Self-pay

## 2019-01-23 ENCOUNTER — Other Ambulatory Visit: Payer: Medicaid Other

## 2019-01-23 DIAGNOSIS — O0990 Supervision of high risk pregnancy, unspecified, unspecified trimester: Secondary | ICD-10-CM

## 2019-01-25 LAB — URINE CULTURE, OB REFLEX

## 2019-01-25 LAB — CULTURE, OB URINE

## 2019-01-30 ENCOUNTER — Other Ambulatory Visit: Payer: Self-pay

## 2019-01-30 ENCOUNTER — Ambulatory Visit (HOSPITAL_COMMUNITY): Payer: BC Managed Care – PPO | Attending: Cardiovascular Disease

## 2019-01-30 DIAGNOSIS — R002 Palpitations: Secondary | ICD-10-CM | POA: Diagnosis not present

## 2019-01-30 DIAGNOSIS — R072 Precordial pain: Secondary | ICD-10-CM | POA: Insufficient documentation

## 2019-01-31 ENCOUNTER — Ambulatory Visit (INDEPENDENT_AMBULATORY_CARE_PROVIDER_SITE_OTHER): Payer: BC Managed Care – PPO

## 2019-01-31 DIAGNOSIS — R002 Palpitations: Secondary | ICD-10-CM | POA: Diagnosis not present

## 2019-02-01 ENCOUNTER — Telehealth (INDEPENDENT_AMBULATORY_CARE_PROVIDER_SITE_OTHER): Payer: BC Managed Care – PPO | Admitting: Obstetrics and Gynecology

## 2019-02-01 ENCOUNTER — Encounter: Payer: Self-pay | Admitting: Obstetrics and Gynecology

## 2019-02-01 DIAGNOSIS — O0992 Supervision of high risk pregnancy, unspecified, second trimester: Secondary | ICD-10-CM

## 2019-02-01 DIAGNOSIS — Z3A22 22 weeks gestation of pregnancy: Secondary | ICD-10-CM

## 2019-02-01 DIAGNOSIS — O0990 Supervision of high risk pregnancy, unspecified, unspecified trimester: Secondary | ICD-10-CM

## 2019-02-01 DIAGNOSIS — O99891 Other specified diseases and conditions complicating pregnancy: Secondary | ICD-10-CM | POA: Diagnosis not present

## 2019-02-01 DIAGNOSIS — G90A Postural orthostatic tachycardia syndrome (POTS): Secondary | ICD-10-CM

## 2019-02-01 DIAGNOSIS — Z8659 Personal history of other mental and behavioral disorders: Secondary | ICD-10-CM

## 2019-02-01 DIAGNOSIS — I498 Other specified cardiac arrhythmias: Secondary | ICD-10-CM

## 2019-02-01 NOTE — Progress Notes (Signed)
TELEHEALTH OBSTETRICS PRENATAL VIRTUAL VIDEO VISIT ENCOUNTER NOTE  Provider location: [facility] at [place]   I connected with Desiree Pearson on 02/01/19 at  9:30 AM EST by MyChart Video Encounter at home and verified that I am speaking with the correct person using two identifiers.   I discussed the limitations, risks, security and privacy concerns of performing an evaluation and management service virtually and the availability of in person appointments. I also discussed with the patient that there may be a patient responsible charge related to this service. The patient expressed understanding and agreed to proceed. Subjective:  Desiree Pearson is a 33 y.o. G2P1 at [redacted]w[redacted]d being seen today for ongoing prenatal care.  She is currently monitored for the following issues for this low-risk pregnancy and has Depressive disorder, not elsewhere classified; History of postpartum depression, currently pregnant; Supervision of high risk pregnancy, antepartum; Wolff-Parkinson-White (WPW) syndrome; Postural orthostatic tachycardia syndrome; Chlamydia infection affecting pregnancy; and Thalassemia alpha carrier on their problem list.  Patient reports no complaints.  Contractions: Not present. Vag. Bleeding: None.  Movement: Present. Denies any leaking of fluid.   The following portions of the patient's history were reviewed and updated as appropriate: allergies, current medications, past family history, past medical history, past social history, past surgical history and problem list.   Objective:  There were no vitals filed for this visit.  Fetal Status:     Movement: Present     General:  Alert, oriented and cooperative. Patient is in no acute distress.  Respiratory: Normal respiratory effort, no problems with respiration noted  Mental Status: Normal mood and affect. Normal behavior. Normal judgment and thought content.  Rest of physical exam deferred due to type of  encounter  Imaging: [place] MFM OB DETAIL +14 WK  Result Date: 01/08/2019 ----------------------------------------------------------------------  OBSTETRICS REPORT                       (Signed Final 01/08/2019 11:12 am) ---------------------------------------------------------------------- Patient Info  ID #:       [id number]                          D.O.B.:  06/28/1985 (33 yrs)  Name:       Desiree Pearson                     Visit Date: 01/08/2019 10:19 am ---------------------------------------------------------------------- Performed By  Performed By:     Berlinda Last          Ref. Address:     [address] [place],                                                              [address]  Attending:  Johnell Comings MD         [facility]  Referred By:      Lezlie Lye NP ---------------------------------------------------------------------- Orders   #  Description                          Code         Ordered By   1  [place] MFM OB DETAIL +14 WK              76811.01     [facility]  ----------------------------------------------------------------------   #  Order #                    Accession #                 Episode #   1  [id number]                  [id number]                  [id number]  ---------------------------------------------------------------------- Indications   Obesity complicating pregnancy, second         O99.212   trimester (prepreg BMI 123456)   Medical complication of pregnancy (WPW         O26.90   syndrome, POTS)   Family history of congenital anomaly (heart    Z82.79   defects)   Encounter for antenatal screening for          Z36.3   malformations (low risk NIPS)   [redacted] weeks gestation of pregnancy                Z3A.19   Genetic carrier (alpha thal.)                  Z14.8   ---------------------------------------------------------------------- Fetal Evaluation  Num Of Fetuses:         1  Fetal Heart Rate(bpm):  135  Cardiac Activity:       Observed  Presentation:           Cephalic  Placenta:               Posterior  P. Cord Insertion:      Visualized  Amniotic Fluid  AFI FV:      Within normal limits ---------------------------------------------------------------------- Biometry  BPD:      42.8  mm     G. Age:  19w 0d         30  %    CI:        71.03   %    70 - 86  FL/HC:      18.4   %    16.1 - 18.3  HC:      161.8  mm     G. Age:  19w 0d         22  %    HC/AC:      1.07        1.09 - 1.39  AC:      151.2  mm     G. Age:  20w 2d         75  %    FL/BPD:     69.6   %  FL:       29.8  mm     G. Age:  19w 1d         34  %    FL/AC:      19.7   %    20 - 24  HUM:      28.3  mm     G. Age:  19w 1d         44  %  CER:      21.4  mm     G. Age:  20w 3d         60  %  Est. FW:     308  gm    0 lb 11 oz      62  % ---------------------------------------------------------------------- OB History  Gravidity:    2         Term:   1        Prem:   0        SAB:   0  TOP:          0       Ectopic:  0        Living: 1 ---------------------------------------------------------------------- Gestational Age  LMP:           19w 3d        Date:  08/25/18                 EDD:   06/01/19  U/S Today:     19w 3d                                        EDD:   06/01/19  Best:          19w 3d     Det. By:  LMP  (08/25/18)          EDD:   06/01/19 ---------------------------------------------------------------------- Anatomy  Cranium:               Appears normal         Aortic Arch:            Not well visualized  Cavum:                 Appears normal         Ductal Arch:            Not well visualized  Ventricles:            Appears normal         Diaphragm:              Appears normal  Choroid Plexus:        Appears normal         Stomach:  Appears normal, left                                                                        sided  Cerebellum:            Appears normal         Abdomen:                Appears normal  Posterior Fossa:       Appears normal         Abdominal Wall:         Appears nml (cord                                                                        insert, abd wall)  Nuchal Fold:           Appears normal         Cord Vessels:           Appears normal (3                                                                        vessel cord)  Face:                  Orbits nl; profile not Kidneys:                Appear normal                         well visualized  Lips:                  Appears normal         Bladder:                Appears normal  Thoracic:              Appears normal         Spine:                  Appears normal  Heart:                 Appears normal         Upper Extremities:      Appears normal                         (4CH, axis, and                         situs)  RVOT:  Appears normal         Lower Extremities:      Appears normal  LVOT:                  Appears normal  Other:  Female gender Heels visualized. Hands not well visualized.          Technically difficult due to maternal habitus and fetal position. ---------------------------------------------------------------------- Cervix Uterus Adnexa  Cervix  Length:            3.9  cm.  Normal appearance by transabdominal scan. ---------------------------------------------------------------------- Comments  This patient was seen for a detailed fetal anatomy scan due  to maternal obesity.  The patient reports that her prior child  was born with a hole in her heart.  Her sister also has a  history of a VSD.  She denies any other significant past medical history and  denies any problems in her current pregnancy.  She had a cell free DNA test earlier in her pregnancy which  indicated a low risk for trisomy 59, 75, and 13. A female fetus  is  predicted.  She was informed that the fetal growth and amniotic fluid  level were appropriate for her gestational age.  Due to the fetal position and maternal body habitus, the views  of the fetal anatomy were limited today.  The patient was informed that anomalies may be missed due  to technical limitations. If the fetus is in a suboptimal position  or maternal habitus is increased, visualization of the fetus in  the maternal uterus may be impaired.  Due to the significant family history of VSDs, the patient was  referred for a fetal echocardiogram with [facility].  A follow-up exam was scheduled in 4 weeks to obtain better  views of the fetal anatomy. ----------------------------------------------------------------------                   Johnell Comings, MD Electronically Signed Final Report   01/08/2019 11:12 am ----------------------------------------------------------------------  ECHOCARDIOGRAM COMPLETE  Result Date: 01/30/2019   ECHOCARDIOGRAM REPORT   Patient Name:   Desiree Pearson   Date of Exam: 01/30/2019 Medical Rec #:  [id number]     Height:       62.0 in Accession #:    [id number]    Weight:       202.8 lb Date of Birth:  [birth year]      BSA:          1.92 m Patient Age:    33 years      BP:           112/82 mmHg Patient Gender: F             HR:           90 bpm. Exam Location:  [place] Procedure: 2D Echo, Cardiac Doppler and Color Doppler Indications:    R07.2 Precordial Pain                 R00.2 Palpitations  History:        Patient has no prior history of Echocardiogram examinations.                 Signs/Symptoms:Dyspnea and Dizziness/Lightheadedness. Pregnancy                 (22 weeks), POTS, Wolfe-Parkinson-White Syndrome status post                 Ablation (2006).  Sonographer:  Deliah Boston RDCS Referring Phys: [place]  1. Left ventricular ejection fraction, by visual estimation, is 60 to 65%. The left ventricle has normal function. There  is no left ventricular hypertrophy.  2. The left ventricle has no regional wall motion abnormalities.  3. Global right ventricle has normal systolic function.The right ventricular size is normal. No increase in right ventricular wall thickness.  4. Left atrial size was normal.  5. Right atrial size was normal.  6. Presence of pericardial fat pad.  7. The mitral valve is grossly normal. No evidence of mitral valve regurgitation.  8. The tricuspid valve is grossly normal.  9. The aortic valve was not well visualized. Aortic valve regurgitation is not visualized. No evidence of aortic valve sclerosis or stenosis. 10. The pulmonic valve was grossly normal. Pulmonic valve regurgitation is not visualized. 11. TR signal is inadequate for assessing pulmonary artery systolic pressure. 12. The inferior vena cava is normal in size with greater than 50% respiratory variability, suggesting right atrial pressure of 3 mmHg. 13. No prior Echocardiogram. FINDINGS  Left Ventricle: Left ventricular ejection fraction, by visual estimation, is 60 to 65%. The left ventricle has normal function. The left ventricle has no regional wall motion abnormalities. The left ventricular internal cavity size was the left ventricle is normal in size. There is no left ventricular hypertrophy. Left ventricular diastolic parameters were normal. Normal left atrial pressure. Right Ventricle: The right ventricular size is normal. No increase in right ventricular wall thickness. Global RV systolic function is has normal systolic function. Left Atrium: Left atrial size was normal in size. Right Atrium: Right atrial size was normal in size Pericardium: There is no evidence of pericardial effusion. Presence of pericardial fat pad. Mitral Valve: The mitral valve is grossly normal. No evidence of mitral valve regurgitation. Tricuspid Valve: The tricuspid valve is grossly normal. Tricuspid valve regurgitation is trivial. Aortic Valve: The aortic valve was not  well visualized. Aortic valve regurgitation is not visualized. The aortic valve is structurally normal, with no evidence of sclerosis or stenosis. Pulmonic Valve: The pulmonic valve was grossly normal. Pulmonic valve regurgitation is not visualized. Pulmonic regurgitation is not visualized. Aorta: The aortic root and ascending aorta are structurally normal, with no evidence of dilitation. Venous: The right upper pulmonary vein is normal. The inferior vena cava is normal in size with greater than 50% respiratory variability, suggesting right atrial pressure of 3 mmHg. IAS/Shunts: No atrial level shunt detected by color flow Doppler.  LEFT VENTRICLE PLAX 2D LVIDd:         2.74 cm  Diastology LVIDs:         0.90 cm  LV e' lateral:   13.20 cm/s LV PW:         0.98 cm  LV E/e' lateral: 7.4 LV IVS:        1.25 cm  LV e' medial:    8.92 cm/s LVOT diam:     2.00 cm  LV E/e' medial:  10.9 LV SV:         26 ml LV SV Index:   12.84 LVOT Area:     3.14 cm  RIGHT VENTRICLE TAPSE (M-mode): 2.2 cm LEFT ATRIUM             Index       RIGHT ATRIUM           Index LA diam:        3.30 cm 1.72 cm/m  RA Area:  11.00 cm LA Vol (A2C):   36.2 ml 18.82 ml/m RA Volume:   24.40 ml  12.69 ml/m LA Vol (A4C):   25.7 ml 13.36 ml/m LA Biplane Vol: 31.6 ml 16.43 ml/m  AORTIC VALVE LVOT Vmax:   108.00 cm/s LVOT Vmean:  66.400 cm/s LVOT VTI:    0.232 m  AORTA Ao Root diam: 2.50 cm Ao Asc diam:  2.40 cm MITRAL VALVE MV Area (PHT): cm                  SHUNTS MV PHT:        msec                 Systemic VTI:  0.23 m MV Decel Time: 257 msec             Systemic Diam: 2.00 cm MV E velocity: 97.50 cm/s 103 cm/s MV A velocity: 90.70 cm/s 70.3 cm/s MV E/A ratio:  1.07       1.5  Eleonore Chiquito MD Electronically signed by Eleonore Chiquito MD Signature Date/Time: 01/30/2019/11:30:46 AM    Final     Assessment and Plan:  Pregnancy: G2P1 at [redacted]w[redacted]d 1. Supervision of high risk pregnancy, antepartum Patient is doing well without complaints Patient  reports some back pain not well controlled with maternity support belt. Advised patient to stretch and exercise Follow up ultrasound scheduled on 02/06/19 and fetal echo on 02/07/19 Third trimester labs next visit  2. History of postpartum depression, currently pregnant Currently stable    Preterm labor symptoms and general obstetric precautions including but not limited to vaginal bleeding, contractions, leaking of fluid and fetal movement were reviewed in detail with the patient. I discussed the assessment and treatment plan with the patient. The patient was provided an opportunity to ask questions and all were answered. The patient agreed with the plan and demonstrated an understanding of the instructions. The patient was advised to call back or seek an in-person office evaluation/go to [facility]U at [facility] for any urgent or concerning symptoms. Please refer to After Visit Summary for other counseling recommendations.   I provided 11 minutes of face-to-face time during this encounter.  Return in about 4 weeks (around 03/01/2019) for in person, ROB, Low risk, 2 hr glucola next visit.  Future Appointments  Date Time Provider [place]  02/06/2019  8:45 AM [place] [facility]  02/06/2019  8:45 AM [facility]-MFC [place] 2 WH-MFCUS M[facility]-US    Mora Bellman, MD [facility], [place]

## 2019-02-01 NOTE — Progress Notes (Signed)
S/w patient for virtual visit. Pt reports fetal movement and occasional back and pelvic pain. Pt states that she does not have BP cuff with her today, but that it has been giving an error message. Advised pt to bring cuff to next visit,

## 2019-02-05 ENCOUNTER — Telehealth: Payer: Self-pay

## 2019-02-05 NOTE — Telephone Encounter (Signed)
Return call to pt c/o increased fatigue and SOB while trying to clean. Pt denies any chest pain, , no swelling, no Hx of asthma.  Pt advised if she was having a hard time breathing to seek emergency assistance asap.  Pt noted she had blurred vision off/on none today.  Pt notes eating 4 meals a day and water intake is not consistent however tires to reach 7 bottles. Pt advised on iron rich food, energy boosting fruits/ veggies. 6-8 small meals, pregnancy exercises and yoga. Pt urged on labor signs and PIH sx's to seek medical evaluation at [facility]U.  And I also restated that any difficulty breathing she needs to seek medical treatment report to [facility]U.  Pt voiced understanding and agreeable.

## 2019-02-06 ENCOUNTER — Ambulatory Visit (HOSPITAL_COMMUNITY)
Admission: RE | Admit: 2019-02-06 | Discharge: 2019-02-06 | Disposition: A | Discharge status: Home / Self Care | Payer: Medicaid Other | Source: Ambulatory Visit | Attending: Obstetrics and Gynecology | Admitting: Obstetrics and Gynecology

## 2019-02-06 ENCOUNTER — Encounter (HOSPITAL_COMMUNITY): Payer: Self-pay

## 2019-02-06 ENCOUNTER — Ambulatory Visit (HOSPITAL_COMMUNITY): Payer: Medicaid Other | Admitting: *Deleted

## 2019-02-06 ENCOUNTER — Other Ambulatory Visit: Payer: Self-pay

## 2019-02-06 DIAGNOSIS — O99891 Other specified diseases and conditions complicating pregnancy: Secondary | ICD-10-CM | POA: Diagnosis present

## 2019-02-06 DIAGNOSIS — Z362 Encounter for other antenatal screening follow-up: Secondary | ICD-10-CM

## 2019-02-06 DIAGNOSIS — Z8659 Personal history of other mental and behavioral disorders: Secondary | ICD-10-CM

## 2019-02-06 DIAGNOSIS — O99212 Obesity complicating pregnancy, second trimester: Secondary | ICD-10-CM | POA: Diagnosis not present

## 2019-02-06 DIAGNOSIS — Z3A23 23 weeks gestation of pregnancy: Secondary | ICD-10-CM

## 2019-02-06 DIAGNOSIS — O3412 Maternal care for benign tumor of corpus uteri, second trimester: Secondary | ICD-10-CM | POA: Diagnosis not present

## 2019-02-06 DIAGNOSIS — O0990 Supervision of high risk pregnancy, unspecified, unspecified trimester: Secondary | ICD-10-CM | POA: Diagnosis present

## 2019-02-06 DIAGNOSIS — I498 Other specified cardiac arrhythmias: Secondary | ICD-10-CM | POA: Diagnosis present

## 2019-02-06 DIAGNOSIS — O2692 Pregnancy related conditions, unspecified, second trimester: Secondary | ICD-10-CM | POA: Diagnosis not present

## 2019-02-06 DIAGNOSIS — G90A Postural orthostatic tachycardia syndrome (POTS): Secondary | ICD-10-CM

## 2019-02-07 ENCOUNTER — Encounter (HOSPITAL_COMMUNITY): Payer: Self-pay | Admitting: Obstetrics and Gynecology

## 2019-02-07 DIAGNOSIS — O358XX0 Maternal care for other (suspected) fetal abnormality and damage, not applicable or unspecified: Secondary | ICD-10-CM | POA: Diagnosis not present

## 2019-02-07 DIAGNOSIS — Z8279 Family history of other congenital malformations, deformations and chromosomal abnormalities: Secondary | ICD-10-CM | POA: Diagnosis not present

## 2019-02-17 DIAGNOSIS — Z973 Presence of spectacles and contact lenses: Secondary | ICD-10-CM | POA: Diagnosis not present

## 2019-02-17 DIAGNOSIS — H109 Unspecified conjunctivitis: Secondary | ICD-10-CM | POA: Diagnosis not present

## 2019-03-01 ENCOUNTER — Other Ambulatory Visit: Payer: BC Managed Care – PPO

## 2019-03-08 ENCOUNTER — Other Ambulatory Visit: Payer: Self-pay

## 2019-03-08 ENCOUNTER — Ambulatory Visit (INDEPENDENT_AMBULATORY_CARE_PROVIDER_SITE_OTHER): Payer: Medicaid Other | Admitting: Advanced Practice Midwife

## 2019-03-08 ENCOUNTER — Encounter: Payer: Self-pay | Admitting: Advanced Practice Midwife

## 2019-03-08 ENCOUNTER — Other Ambulatory Visit (HOSPITAL_COMMUNITY)
Admission: RE | Admit: 2019-03-08 | Discharge: 2019-03-08 | Disposition: A | Discharge status: Home / Self Care | Payer: Medicaid Other | Source: Ambulatory Visit

## 2019-03-08 ENCOUNTER — Other Ambulatory Visit: Payer: Medicaid Other

## 2019-03-08 VITALS — BP 113/78 | HR 89 | Wt 206.0 lb

## 2019-03-08 DIAGNOSIS — O0990 Supervision of high risk pregnancy, unspecified, unspecified trimester: Secondary | ICD-10-CM

## 2019-03-08 DIAGNOSIS — O0992 Supervision of high risk pregnancy, unspecified, second trimester: Secondary | ICD-10-CM

## 2019-03-08 DIAGNOSIS — Z3A27 27 weeks gestation of pregnancy: Secondary | ICD-10-CM

## 2019-03-08 DIAGNOSIS — N898 Other specified noninflammatory disorders of vagina: Secondary | ICD-10-CM | POA: Diagnosis not present

## 2019-03-08 DIAGNOSIS — Z3009 Encounter for other general counseling and advice on contraception: Secondary | ICD-10-CM

## 2019-03-08 DIAGNOSIS — M7918 Myalgia, other site: Secondary | ICD-10-CM

## 2019-03-08 NOTE — Progress Notes (Signed)
   PRENATAL VISIT NOTE  Subjective:  Desiree Pearson is a 34 y.o. G2P1 at [redacted]w[redacted]d being seen today for ongoing prenatal care.  She is currently monitored for the following issues for this high-risk pregnancy and has Depressive disorder, not elsewhere classified; History of postpartum depression, currently pregnant; Supervision of high risk pregnancy, antepartum; Wolff-Parkinson-White (WPW) syndrome; Postural orthostatic tachycardia syndrome; Chlamydia infection affecting pregnancy; and Thalassemia alpha carrier on their problem list.  Patient reports occasional contractions and recurrent dark brown vaginal discharge with foul odor  Contractions: Irritability. Vag. Bleeding: None.  Movement: Present. Denies leaking of fluid.   Patient states she recently moved to a new house and engaged in heavy lifting, moving boxes and breaking down bed frames a few days ago. She states during her move she didn't eat until around 4pm.  The following portions of the patient's history were reviewed and updated as appropriate: allergies, current medications, past family history, past medical history, past social history, past surgical history and problem list. Problem list updated.  Objective:   Vitals:   03/08/19 0907  BP: 113/78  Pulse: 89  Weight: 206 lb (93.4 kg)    Fetal Status: Fetal Heart Rate (bpm): 142 Fundal Height: 28 cm Movement: Present     General:  Alert, oriented and cooperative. Patient is in no acute distress.  Skin: Skin is warm and dry. No rash noted.   Cardiovascular: Normal heart rate noted  Respiratory: Normal respiratory effort, no problems with respiration noted  Abdomen: Soft, gravid, appropriate for gestational age.  Pain/Pressure: Present     Pelvic: Cervical exam deferred Dilation: Visually closed on SSE. Thick frothy white discharge throughout vaginal vault. No bleeding or blood-tinged discharge observed      Extremities: Normal range of motion.  Edema: None  Mental Status: Normal  mood and affect. Normal behavior. Normal judgment and thought content.   Assessment and Plan:  Pregnancy: G2P1 at [redacted]w[redacted]d  1. Supervision of high risk pregnancy, antepartum - Continue routine care - Reviewed recommendations for lifting, eating and drinking in third trimester  2. Musculoskeletal pain - Reviewed lifting restrictions  3. Vaginal discharge  - Cervicovaginal ancillary only( [place])  4. Unwanted Fertility --Tubal consent signed today  Preterm labor symptoms and general obstetric precautions including but not limited to vaginal bleeding, contractions, leaking of fluid and fetal movement were reviewed in detail with the patient. Please refer to After Visit Summary for other counseling recommendations.  Return in about 4 weeks (around 04/05/2019).  Future Appointments  Date Time Provider [place]  04/05/2019  9:30 AM Chancy Milroy, MD [place] None    Darlina Rumpf, [place]

## 2019-03-08 NOTE — Patient Instructions (Addendum)
Third Trimester of Pregnancy  The third trimester is from week 28 through week 40 (months 7 through 9). This trimester is when your unborn baby (fetus) is growing very fast. At the end of the ninth month, the unborn baby is about 20 inches in length. It weighs about 6-10 pounds. Follow these instructions at home: Medicines  Take over-the-counter and prescription medicines only as told by your doctor. Some medicines are safe and some medicines are not safe during pregnancy.  Take a prenatal vitamin that contains at least 600 micrograms (mcg) of folic acid.  If you have trouble pooping (constipation), take medicine that will make your stool soft (stool softener) if your doctor approves. Eating and drinking   Eat regular, healthy meals.  Avoid raw meat and uncooked cheese.  If you get low calcium from the food you eat, talk to your doctor about taking a daily calcium supplement.  Eat four or five small meals rather than three large meals a day.  Avoid foods that are high in fat and sugars, such as fried and sweet foods.  To prevent constipation: ? Eat foods that are high in fiber, like fresh fruits and vegetables, whole grains, and beans. ? Drink enough fluids to keep your pee (urine) clear or pale yellow. Activity  Exercise only as told by your doctor. Stop exercising if you start to have cramps.  Avoid heavy lifting, wear low heels, and sit up straight.  Do not exercise if it is too hot, too humid, or if you are in a place of great height (high altitude).  You may continue to have sex unless your doctor tells you not to. Relieving pain and discomfort  Wear a good support bra if your breasts are tender.  Take frequent breaks and rest with your legs raised if you have leg cramps or low back pain.  Take warm water baths (sitz baths) to soothe pain or discomfort caused by hemorrhoids. Use hemorrhoid cream if your doctor approves.  If you develop puffy, bulging veins (varicose  veins) in your legs: ? Wear support hose or compression stockings as told by your doctor. ? Raise (elevate) your feet for 15 minutes, 3-4 times a day. ? Limit salt in your food. Safety  Wear your seat belt when driving.  Make a list of emergency phone numbers, including numbers for family, friends, the hospital, and police and fire departments. Preparing for your baby's arrival To prepare for the arrival of your baby:  Take prenatal classes.  Practice driving to the hospital.  Visit the hospital and tour the maternity area.  Talk to your work about taking leave once the baby comes.  Pack your hospital bag.  Prepare the baby's room.  Go to your doctor visits.  Buy a rear-facing car seat. Learn how to install it in your car. General instructions  Do not use hot tubs, steam rooms, or saunas.  Do not use any products that contain nicotine or tobacco, such as cigarettes and e-cigarettes. If you need help quitting, ask your doctor.  Do not drink alcohol.  Do not douche or use tampons or scented sanitary pads.  Do not cross your legs for long periods of time.  Do not travel for long distances unless you must. Only do so if your doctor says it is okay.  Visit your dentist if you have not gone during your pregnancy. Use a soft toothbrush to brush your teeth. Be gentle when you floss.  Avoid cat litter boxes and soil   used by cats. These carry germs that can cause birth defects in the baby and can cause a loss of your baby (miscarriage) or stillbirth.  Keep all your prenatal visits as told by your doctor. This is important. Contact a doctor if:  You are not sure if you are in labor or if your water has broken.  You are dizzy.  You have mild cramps or pressure in your lower belly.  You have a nagging pain in your belly area.  You continue to feel sick to your stomach, you throw up, or you have watery poop.  You have bad smelling fluid coming from your vagina.  You have  pain when you pee. Get help right away if:  You have a fever.  You are leaking fluid from your vagina.  You are spotting or bleeding from your vagina.  You have severe belly cramps or pain.  You lose or gain weight quickly.  You have trouble catching your breath and have chest pain.  You notice sudden or extreme puffiness (swelling) of your face, hands, ankles, feet, or legs.  You have not felt the baby move in over an hour.  You have severe headaches that do not go away with medicine.  You have trouble seeing.  You are leaking, or you are having a gush of fluid, from your vagina before you are 37 weeks.  You have regular belly spasms (contractions) before you are 37 weeks. Summary  The third trimester is from week 28 through week 40 (months 7 through 9). This time is when your unborn baby is growing very fast.  Follow your doctor's advice about medicine, food, and activity.  Get ready for the arrival of your baby by taking prenatal classes, getting all the baby items ready, preparing the baby's room, and visiting your doctor to be checked.  Get help right away if you are bleeding from your vagina, or you have chest pain and trouble catching your breath, or if you have not felt your baby move in over an hour. This information is not intended to replace advice given to you by your health care provider. Make sure you discuss any questions you have with your health care provider. Document Revised: 05/11/2018 Document Reviewed: 02/24/2016 Elsevier Patient Education  2020 [place].  Fetal Movement Counts Patient Name: ________________________________________________ Patient Due Date: ____________________ What is a fetal movement count?  A fetal movement count is the number of times that you feel your baby move during a certain amount of time. This may also be called a fetal kick count. A fetal movement count is recommended for every pregnant woman. You may be asked to start  counting fetal movements as early as week 28 of your pregnancy. Pay attention to when your baby is most active. You may notice your baby's sleep and wake cycles. You may also notice things that make your baby move more. You should do a fetal movement count:  When your baby is normally most active.  At the same time each day. A good time to count movements is while you are resting, after having something to eat and drink. How do I count fetal movements? 1. Find a quiet, comfortable area. Sit, or lie down on your side. 2. Write down the date, the start time and stop time, and the number of movements that you felt between those two times. Take this information with you to your health care visits. 3. Write down your start time when you feel the  first movement. 4. Count kicks, flutters, swishes, rolls, and jabs. You should feel at least 10 movements. 5. You may stop counting after you have felt 10 movements, or if you have been counting for 2 hours. Write down the stop time. 6. If you do not feel 10 movements in 2 hours, contact your health care provider for further instructions. Your health care provider may want to do additional tests to assess your baby's well-being. Contact a health care provider if:  You feel fewer than 10 movements in 2 hours.  Your baby is not moving like he or she usually does. Date: ____________ Start time: ____________ Stop time: ____________ Movements: ____________ Date: ____________ Start time: ____________ Stop time: ____________ Movements: ____________ Date: ____________ Start time: ____________ Stop time: ____________ Movements: ____________ Date: ____________ Start time: ____________ Stop time: ____________ Movements: ____________ Date: ____________ Start time: ____________ Stop time: ____________ Movements: ____________ Date: ____________ Start time: ____________ Stop time: ____________ Movements: ____________ Date: ____________ Start time: ____________ Stop  time: ____________ Movements: ____________ Date: ____________ Start time: ____________ Stop time: ____________ Movements: ____________ Date: ____________ Start time: ____________ Stop time: ____________ Movements: ____________ This information is not intended to replace advice given to you by your health care provider. Make sure you discuss any questions you have with your health care provider. Document Revised: 09/07/2018 Document Reviewed: 09/07/2018 Elsevier Patient Education  [place].

## 2019-03-08 NOTE — Progress Notes (Signed)
ROB  Tdap: declined   CC: Brown discharge with odor. Pt notes having recurrent BV.pt will get vaginal swab done today.

## 2019-03-09 LAB — GLUCOSE TOLERANCE, 2 HOURS W/ 1HR
Glucose, 1 hour: 122 mg/dL (ref 65–179)
Glucose, 2 hour: 74 mg/dL (ref 65–152)
Glucose, Fasting: 67 mg/dL (ref 65–91)

## 2019-03-09 LAB — CERVICOVAGINAL ANCILLARY ONLY
Bacterial Vaginitis (gardnerella): POSITIVE — AB
Candida Glabrata: NEGATIVE
Candida Vaginitis: POSITIVE — AB
Chlamydia: NEGATIVE
Comment: NEGATIVE
Comment: NEGATIVE
Comment: NEGATIVE
Comment: NEGATIVE
Comment: NEGATIVE
Comment: NORMAL
Neisseria Gonorrhea: NEGATIVE
Trichomonas: NEGATIVE

## 2019-03-09 LAB — CBC
Hematocrit: 36.4 % (ref 34.0–46.6)
Hemoglobin: 11.4 g/dL (ref 11.1–15.9)
MCH: 23.7 pg — ABNORMAL LOW (ref 26.6–33.0)
MCHC: 31.3 g/dL — ABNORMAL LOW (ref 31.5–35.7)
MCV: 76 fL — ABNORMAL LOW (ref 79–97)
Platelets: 145 10*3/uL — ABNORMAL LOW (ref 150–450)
RBC: 4.82 x10E6/uL (ref 3.77–5.28)
RDW: 16.2 % — ABNORMAL HIGH (ref 11.7–15.4)
WBC: 6.2 10*3/uL (ref 3.4–10.8)

## 2019-03-09 LAB — RPR: RPR Ser Ql: NONREACTIVE

## 2019-03-09 LAB — HIV ANTIBODY (ROUTINE TESTING W REFLEX): HIV Screen 4th Generation wRfx: NONREACTIVE

## 2019-03-10 ENCOUNTER — Other Ambulatory Visit: Payer: Self-pay | Admitting: Advanced Practice Midwife

## 2019-03-10 DIAGNOSIS — N898 Other specified noninflammatory disorders of vagina: Secondary | ICD-10-CM

## 2019-03-10 MED ORDER — TERCONAZOLE 0.4 % VA CREA: 1.0000 | TOPICAL_CREAM | Freq: Every day | VAGINAL | 0 refills | Status: DC

## 2019-03-10 NOTE — Progress Notes (Signed)
Swab results + for yeast and BV. Patient notified via active [place], MSN, CNM Certified Nurse Midwife, [employer] for [employer], [employer] 03/10/19 9:49 PM

## 2019-04-05 ENCOUNTER — Telehealth (INDEPENDENT_AMBULATORY_CARE_PROVIDER_SITE_OTHER): Payer: Medicaid Other | Admitting: Obstetrics and Gynecology

## 2019-04-05 ENCOUNTER — Encounter: Payer: Self-pay | Admitting: Obstetrics and Gynecology

## 2019-04-05 DIAGNOSIS — Z8774 Personal history of (corrected) congenital malformations of heart and circulatory system: Secondary | ICD-10-CM

## 2019-04-05 DIAGNOSIS — O0990 Supervision of high risk pregnancy, unspecified, unspecified trimester: Secondary | ICD-10-CM

## 2019-04-05 DIAGNOSIS — Z3009 Encounter for other general counseling and advice on contraception: Secondary | ICD-10-CM | POA: Insufficient documentation

## 2019-04-05 DIAGNOSIS — O0993 Supervision of high risk pregnancy, unspecified, third trimester: Secondary | ICD-10-CM

## 2019-04-05 DIAGNOSIS — D563 Thalassemia minor: Secondary | ICD-10-CM

## 2019-04-05 NOTE — Progress Notes (Addendum)
I connected with  Desiree Pearson on 04/05/19 by a video enabled telemedicine application and verified that I am speaking with the correct person using two identifiers.   I discussed the limitations of evaluation and management by telemedicine. The patient expressed understanding and agreed to proceed.  ROB.  C/o pelvic, back and abdominal pain 9/10 x 7+ days and LOF, sticky, googy, old blood like. She is under a lot of stress and no family support.  She has not been checking her BP.

## 2019-04-05 NOTE — Progress Notes (Signed)
   TELEHEALTH VIRTUAL OBSTETRICS VISIT ENCOUNTER NOTE  I connected with Desiree Pearson on 04/05/19 at  9:30 AM EST by telephone at home and verified that I am speaking with the correct person using two identifiers.   I discussed the limitations, risks, security and privacy concerns of performing an evaluation and management service by telephone and the availability of in person appointments. I also discussed with the patient that there may be a patient responsible charge related to this service. The patient expressed understanding and agreed to proceed.  Subjective:  Desiree Pearson is a 34 y.o. G2P1 at [redacted]w[redacted]d being followed for ongoing prenatal care.  She is currently monitored for the following issues for this high-risk pregnancy and has Depressive disorder, not elsewhere classified; History of postpartum depression, currently pregnant; Supervision of high risk pregnancy, antepartum; Wolff-Parkinson-White (WPW) syndrome; Postural orthostatic tachycardia syndrome; Thalassemia alpha carrier; Unwanted fertility; and History of congenital heart defect on their problem list.  Patient reports decreased fetal movement, some ut ctx and vaginal spotting. Also dealing with some personal issues. Feels very stressed. Reports fetal movement. Denies any contractions, bleeding or leaking of fluid.   The following portions of the patient's history were reviewed and updated as appropriate: allergies, current medications, past family history, past medical history, past social history, past surgical history and problem list.   Objective:   General:  Alert, oriented and cooperative.   Mental Status: Normal mood and affect perceived. Normal judgment and thought content.  Rest of physical exam deferred due to type of encounter  Assessment and Plan:  Pregnancy: G2P1 at [redacted]w[redacted]d 1. Supervision of high risk pregnancy, antepartum Stable Advised to go to MAU with above complaints. Offered referral to [facility] but pt  declined Will refer to Andrea,SW  - Ambulatory referral to [place]  2. Thalassemia alpha carrier Stable  3. Unwanted fertility BTL papers signed  4. History of congenital heart defect Nl fetal ECHO  Preterm labor symptoms and general obstetric precautions including but not limited to vaginal bleeding, contractions, leaking of fluid and fetal movement were reviewed in detail with the patient.  I discussed the assessment and treatment plan with the patient. The patient was provided an opportunity to ask questions and all were answered. The patient agreed with the plan and demonstrated an understanding of the instructions. The patient was advised to call back or seek an in-person office evaluation/go to MAU at [facility] for any urgent or concerning symptoms. Please refer to After Visit Summary for other counseling recommendations.   I provided 10 minutes of non-face-to-face time during this encounter.  Return in about 2 weeks (around 04/19/2019) for OB visit, face to face.  Future Appointments  Date Time Provider [place]  04/05/2019  9:30 AM Chancy Milroy, MD [place] None    Chancy Milroy, [place] for [facility], [place]

## 2019-04-10 ENCOUNTER — Institutional Professional Consult (permissible substitution): Payer: Medicaid Other | Admitting: Licensed Clinical Social Worker

## 2019-04-19 ENCOUNTER — Other Ambulatory Visit: Payer: Self-pay

## 2019-04-19 ENCOUNTER — Ambulatory Visit (INDEPENDENT_AMBULATORY_CARE_PROVIDER_SITE_OTHER): Payer: Medicaid Other | Admitting: Family Medicine

## 2019-04-19 ENCOUNTER — Encounter: Payer: Self-pay | Admitting: Family Medicine

## 2019-04-19 VITALS — Wt 204.7 lb

## 2019-04-19 DIAGNOSIS — Z3009 Encounter for other general counseling and advice on contraception: Secondary | ICD-10-CM

## 2019-04-19 DIAGNOSIS — I456 Pre-excitation syndrome: Secondary | ICD-10-CM

## 2019-04-19 DIAGNOSIS — Z3A33 33 weeks gestation of pregnancy: Secondary | ICD-10-CM

## 2019-04-19 DIAGNOSIS — O34219 Maternal care for unspecified type scar from previous cesarean delivery: Secondary | ICD-10-CM

## 2019-04-19 DIAGNOSIS — O0990 Supervision of high risk pregnancy, unspecified, unspecified trimester: Secondary | ICD-10-CM

## 2019-04-19 DIAGNOSIS — D563 Thalassemia minor: Secondary | ICD-10-CM

## 2019-04-19 DIAGNOSIS — O99413 Diseases of the circulatory system complicating pregnancy, third trimester: Secondary | ICD-10-CM

## 2019-04-19 MED ORDER — MEDELA DOUBLE BREAST PUMP MISC: 1.0000 | 0 refills | Status: DC | PRN

## 2019-04-19 NOTE — Progress Notes (Signed)
Pt presents ROB requesting repeat c/s.   Pt will sign ROI for OP notes.

## 2019-04-19 NOTE — Patient Instructions (Signed)

## 2019-04-19 NOTE — Progress Notes (Signed)
    PRENATAL VISIT NOTE  Subjective:  Desiree Pearson is a 34 y.o. G2P1 at [redacted]w[redacted]d being seen today for ongoing prenatal care.  She is currently monitored for the following issues for this low-risk pregnancy and has Depressive disorder, not elsewhere classified; History of postpartum depression, currently pregnant; Supervision of high risk pregnancy, antepartum; Wolff-Parkinson-White (WPW) syndrome; Postural orthostatic tachycardia syndrome; Thalassemia alpha carrier; Unwanted fertility; and History of congenital heart defect on their problem list.  Patient reports no complaints.  Contractions: Irritability. Vag. Bleeding: None.  Movement: Present. Denies leaking of fluid.   The following portions of the patient's history were reviewed and updated as appropriate: allergies, current medications, past family history, past medical history, past social history, past surgical history and problem list.   Objective:   Vitals:   04/19/19 0956  Weight: 204 lb 11.2 oz (92.9 kg)    Fetal Status: Fetal Heart Rate (bpm): 140    Movement: Present     General:  Alert, oriented and cooperative. Patient is in no acute distress.  Skin: Skin is warm and dry. No rash noted.   Cardiovascular: Normal heart rate noted  Respiratory: Normal respiratory effort, no problems with respiration noted  Abdomen: Soft, gravid, appropriate for gestational age.  Pain/Pressure: Present     Pelvic: Cervical exam deferred        Extremities: Normal range of motion.  Edema: Trace  Mental Status: Normal mood and affect. Normal behavior. Normal judgment and thought content.   Assessment and Plan:  Pregnancy: G2P1 at [redacted]w[redacted]d 1. Supervision of high risk pregnancy, antepartum - Misc. Devices (MEDELA DOUBLE BREAST PUMP) MISC; 1 Device by Does not apply route as needed.  Dispense: 1 each; Refill: 0  2. Wolff-Parkinson-White (WPW) syndrome S/p Ablation  3. Thalassemia alpha carrier   4. Previous cesarean section Elects for RCS  with bilateral salpingectomy  5. Unwanted fertility For BTL  Preterm labor symptoms and general obstetric precautions including but not limited to vaginal bleeding, contractions, leaking of fluid and fetal movement were reviewed in detail with the patient. Please refer to After Visit Summary for other counseling recommendations.   Return in about 2 weeks (around 05/03/2019) for in person.  Future Appointments  Date Time Provider [place]  05/03/2019  9:55 AM Bur[place], Rona Ravens, NP [facility] None    Donnamae Jude, MD

## 2019-05-03 ENCOUNTER — Encounter: Payer: Self-pay | Admitting: Nurse Practitioner

## 2019-05-03 ENCOUNTER — Other Ambulatory Visit (HOSPITAL_COMMUNITY)
Admission: RE | Admit: 2019-05-03 | Discharge: 2019-05-03 | Disposition: A | Discharge status: Home / Self Care | Payer: Medicaid Other | Source: Ambulatory Visit | Attending: Nurse Practitioner | Admitting: Nurse Practitioner

## 2019-05-03 ENCOUNTER — Ambulatory Visit (INDEPENDENT_AMBULATORY_CARE_PROVIDER_SITE_OTHER): Payer: Medicaid Other | Admitting: Nurse Practitioner

## 2019-05-03 ENCOUNTER — Other Ambulatory Visit: Payer: Self-pay

## 2019-05-03 VITALS — BP 112/77 | HR 111 | Wt 205.0 lb

## 2019-05-03 DIAGNOSIS — N898 Other specified noninflammatory disorders of vagina: Secondary | ICD-10-CM | POA: Diagnosis not present

## 2019-05-03 DIAGNOSIS — O26893 Other specified pregnancy related conditions, third trimester: Secondary | ICD-10-CM

## 2019-05-03 DIAGNOSIS — R102 Pelvic and perineal pain: Secondary | ICD-10-CM

## 2019-05-03 DIAGNOSIS — Z3A35 35 weeks gestation of pregnancy: Secondary | ICD-10-CM

## 2019-05-03 DIAGNOSIS — O0990 Supervision of high risk pregnancy, unspecified, unspecified trimester: Secondary | ICD-10-CM

## 2019-05-03 MED ORDER — TERCONAZOLE 0.4 % VA CREA: 1.0000 | TOPICAL_CREAM | Freq: Every day | VAGINAL | 1 refills | Status: AC

## 2019-05-03 NOTE — Progress Notes (Signed)
    Subjective:  Desiree Pearson is a 34 y.o. G2P1 at [redacted]w[redacted]d being seen today for ongoing prenatal care.  She is currently monitored for the following issues for this high-risk pregnancy and has Depressive disorder, not elsewhere classified; History of postpartum depression, currently pregnant; Supervision of high risk pregnancy, antepartum; Wolff-Parkinson-White (WPW) syndrome; Postural orthostatic tachycardia syndrome; Thalassemia alpha carrier; Unwanted fertility; History of congenital heart defect; and Previous cesarean delivery affecting pregnancy, antepartum on their problem list.  Patient reports vaginal itching.  Contractions: Irritability. Vag. Bleeding: None.  Movement: Present. Denies leaking of fluid.   The following portions of the patient's history were reviewed and updated as appropriate: allergies, current medications, past family history, past medical history, past social history, past surgical history and problem list. Problem list updated.  Objective:   Vitals:   05/03/19 1000  BP: 112/77  Pulse: (!) 111  Weight: 205 lb (93 kg)    Fetal Status: Fetal Heart Rate (bpm): 144 Fundal Height: 37 cm Movement: Present     General:  Alert, oriented and cooperative. Patient is in no acute distress.  Skin: Skin is warm and dry. No rash noted.   Cardiovascular: Normal heart rate noted  Respiratory: Normal respiratory effort, no problems with respiration noted  Abdomen: Soft, gravid, appropriate for gestational age. Pain/Pressure: Present     Pelvic:  Cervical exam deferred        Extremities: Normal range of motion.  Edema: None  Mental Status: Normal mood and affect. Normal behavior. Normal judgment and thought content.   Urinalysis:      Assessment and Plan:  Pregnancy: G2P1 at [redacted]w[redacted]d  1. Supervision of high risk pregnancy, antepartum Baby moving well No BPs in Babyscripts   2. Vaginal discharge Itching Vaginal swab done Terazol vaginal cream sent to her pharmacy GBS NOT  DONE TODAY as it is more than 4 weeks until planned C/S - will do at visit next week.  3.  Pubic symphysis pain Declines PT appointment Encouraged to wear pregnancy support belt  Exercises to stretch lower back  Preterm labor symptoms and general obstetric precautions including but not limited to vaginal bleeding, contractions, leaking of fluid and fetal movement were reviewed in detail with the patient. Please refer to After Visit Summary for other counseling recommendations.  Return in about 1 week (around 05/10/2019) for In person ROB for vaginal swab.  Earlie Server, RN, MSN, NP-BC Nurse Practitioner, [facility] for [employer], [place] [employer] 05/03/2019 10:40 AM

## 2019-05-03 NOTE — Patient Instructions (Signed)

## 2019-05-03 NOTE — Progress Notes (Signed)
ROB   CC: vaginal discharge and odor  Pt believes is yeast infection wants treatment.

## 2019-05-04 ENCOUNTER — Inpatient Hospital Stay (HOSPITAL_COMMUNITY)
Admission: AD | Admit: 2019-05-04 | Discharge: 2019-05-04 | Disposition: A | Discharge status: Home / Self Care | Payer: Medicaid Other | Attending: Obstetrics & Gynecology | Admitting: Obstetrics & Gynecology

## 2019-05-04 ENCOUNTER — Other Ambulatory Visit: Payer: Self-pay

## 2019-05-04 ENCOUNTER — Inpatient Hospital Stay (HOSPITAL_BASED_OUTPATIENT_CLINIC_OR_DEPARTMENT_OTHER): Payer: Medicaid Other

## 2019-05-04 ENCOUNTER — Encounter (HOSPITAL_COMMUNITY): Payer: Self-pay | Admitting: Obstetrics & Gynecology

## 2019-05-04 DIAGNOSIS — E669 Obesity, unspecified: Secondary | ICD-10-CM

## 2019-05-04 DIAGNOSIS — Z8279 Family history of other congenital malformations, deformations and chromosomal abnormalities: Secondary | ICD-10-CM | POA: Diagnosis not present

## 2019-05-04 DIAGNOSIS — O3413 Maternal care for benign tumor of corpus uteri, third trimester: Secondary | ICD-10-CM | POA: Diagnosis not present

## 2019-05-04 DIAGNOSIS — B373 Candidiasis of vulva and vagina: Secondary | ICD-10-CM | POA: Insufficient documentation

## 2019-05-04 DIAGNOSIS — O99213 Obesity complicating pregnancy, third trimester: Secondary | ICD-10-CM | POA: Diagnosis not present

## 2019-05-04 DIAGNOSIS — O99413 Diseases of the circulatory system complicating pregnancy, third trimester: Secondary | ICD-10-CM | POA: Diagnosis not present

## 2019-05-04 DIAGNOSIS — O23593 Infection of other part of genital tract in pregnancy, third trimester: Secondary | ICD-10-CM

## 2019-05-04 DIAGNOSIS — Z148 Genetic carrier of other disease: Secondary | ICD-10-CM | POA: Diagnosis not present

## 2019-05-04 DIAGNOSIS — R109 Unspecified abdominal pain: Secondary | ICD-10-CM

## 2019-05-04 DIAGNOSIS — Z8659 Personal history of other mental and behavioral disorders: Secondary | ICD-10-CM

## 2019-05-04 DIAGNOSIS — O99891 Other specified diseases and conditions complicating pregnancy: Secondary | ICD-10-CM

## 2019-05-04 DIAGNOSIS — I456 Pre-excitation syndrome: Secondary | ICD-10-CM | POA: Diagnosis not present

## 2019-05-04 DIAGNOSIS — O289 Unspecified abnormal findings on antenatal screening of mother: Secondary | ICD-10-CM

## 2019-05-04 DIAGNOSIS — Z3A36 36 weeks gestation of pregnancy: Secondary | ICD-10-CM | POA: Diagnosis not present

## 2019-05-04 DIAGNOSIS — O26893 Other specified pregnancy related conditions, third trimester: Secondary | ICD-10-CM

## 2019-05-04 DIAGNOSIS — G90A Postural orthostatic tachycardia syndrome (POTS): Secondary | ICD-10-CM

## 2019-05-04 DIAGNOSIS — O98813 Other maternal infectious and parasitic diseases complicating pregnancy, third trimester: Secondary | ICD-10-CM | POA: Insufficient documentation

## 2019-05-04 DIAGNOSIS — O0990 Supervision of high risk pregnancy, unspecified, unspecified trimester: Secondary | ICD-10-CM

## 2019-05-04 LAB — CERVICOVAGINAL ANCILLARY ONLY
Candida Glabrata: NEGATIVE
Candida Vaginitis: POSITIVE — AB
Chlamydia: NEGATIVE
Comment: NEGATIVE
Comment: NEGATIVE
Comment: NEGATIVE
Comment: NEGATIVE
Comment: NORMAL
Neisseria Gonorrhea: NEGATIVE
Trichomonas: NEGATIVE

## 2019-05-04 MED ORDER — CYCLOBENZAPRINE HCL 10 MG PO TABS: 10.0000 mg | ORAL_TABLET | Freq: Two times a day (BID) | ORAL | 0 refills | Status: AC | PRN

## 2019-05-04 NOTE — MAU Note (Signed)
Wednesday night, started having pulling sensation in belly button, not easing up, couldn't sleep well. Been having braxton hicks contractions, pressure in low abd is worse since Wednesday also.   No bleeding.  No leaking. Baby movement is decreased, drank cold water but didn't help.

## 2019-05-04 NOTE — Discharge Instructions (Signed)
Abdominal Pain During Pregnancy  Abdominal pain is common during pregnancy, and has many possible causes. Some causes are more serious than others, and sometimes the cause is not known. Abdominal pain can be a sign that labor is starting. It can also be caused by normal growth and stretching of muscles and ligaments during pregnancy. Always tell your health care provider if you have any abdominal pain. Follow these instructions at home:  Do not have sex or put anything in your vagina until your pain goes away completely.  Get plenty of rest until your pain improves.  Drink enough fluid to keep your urine pale yellow.  Take over-the-counter and prescription medicines only as told by your health care provider.  Keep all follow-up visits as told by your health care provider. This is important. Contact a health care provider if:  Your pain continues or gets worse after resting.  You have lower abdominal pain that: ? Comes and goes at regular intervals. ? Spreads to your back. ? Is similar to menstrual cramps.  You have pain or burning when you urinate. Get help right away if:  You have a fever or chills.  You have vaginal bleeding.  You are leaking fluid from your vagina.  You are passing tissue from your vagina.  You have vomiting or diarrhea that lasts for more than 24 hours.  Your baby is moving less than usual.  You feel very weak or faint.  You have shortness of breath.  You develop severe pain in your upper abdomen. Summary  Abdominal pain is common during pregnancy, and has many possible causes.  If you experience abdominal pain during pregnancy, tell your health care provider right away.  Follow your health care provider's home care instructions and keep all follow-up visits as directed. This information is not intended to replace advice given to you by your health care provider. Make sure you discuss any questions you have with your health care  provider. Document Revised: 05/08/2018 Document Reviewed: 04/22/2016 Elsevier Patient Education  2020 [employer]  

## 2019-05-04 NOTE — MAU Provider Note (Signed)
History     CSN: [id number]  Arrival date and time: 05/04/19 0435   First Provider Initiated Contact with Patient 05/04/19 0526      Chief Complaint  Patient presents with  . Contractions  . Abdominal Pain   Desiree Pearson is a 34 y.o. G2P1001 at [redacted]w[redacted]d who receives care at [facility].  She presents today for Contractions and Abdominal Pain.  She states she is having "a lot of pain in her pelvic and belly button pain that makes it painful to walk, but I came here today mainly for the belly button pain."  She states the pain began Wednesday night and she had an appt yesterday in office and was told to come to MAU if it worsened.  Patient states "it feels like a cord is being pulled from my belly button on the inside... it's just yanking it."  She states she has been unable to sleep due to the pain and rates it a 10/10.  She reports she has tried "drinking water, eating something, and position changes without relief."  Patient reports the pain is not aggravated by any known factors.  She states "it feels like he's slithering" in regards to fetal movement. Patient endorses contractions stating that her belly "feels tight consistently tonight, but normally it is off and on." Patient reports that she has been having "brown discharge for awhile, but they said it was normal."  However, she denies leaking, bleeding, or burning, but states "I have a little bit of itching and always have BV so it's an odor."  Patient denies sexual activity in the past 3 days.      OB History    Gravida  2   Para  1   Term      Preterm      AB      Living  1     SAB      TAB      Ectopic      Multiple      Live Births  1           Past Medical History:  Diagnosis Date  . Chlamydia infection affecting pregnancy 11/29/2018  . Postural orthostatic tachycardia syndrome   . Wolff-Parkinson-White (WPW) syndrome     Past Surgical History:  Procedure Laterality Date  . ABLATION  2006   for Wolf  parkinson white disease  . CESAREAN SECTION      Family History  Problem Relation Age of Onset  . Diabetes Father   . Hypertension Father     Social History   Tobacco Use  . Smoking status: Never Smoker  . Smokeless tobacco: Never Used  Substance Use Topics  . Alcohol use: Not Currently    Comment: occasionally wine  . Drug use: Never    Allergies:  Allergies  Allergen Reactions  . Morphine Rash and Other (See Comments)    Heart rate dropped  . Sulfur Rash    Medications Prior to Admission  Medication Sig Dispense Refill Last Dose  . Prenatal Vit-Fe Fumarate-FA (PRENATAL VITAMINS PO) Take 1 tablet by mouth daily.   05/03/2019 at Unknown time  . Blood Pressure Monitoring DEVI 1 Device by Does not apply route once a week. (Patient not taking: Reported on 05/03/2019) 1 Device 0   . folic acid (FOLVITE) 1 MG tablet Take 1 mg by mouth daily.     . Misc. Devices (MEDELA DOUBLE BREAST PUMP) MISC 1 Device by Does not apply route as needed.  1 each 0   . Misc. Devices MISC Dispense one maternity belt for patient (Patient not taking: Reported on 04/19/2019) 1 each 0   . terconazole (TERAZOL 7) 0.4 % vaginal cream Place 1 applicator vaginally at bedtime for 7 days. 45 g 1     Review of Systems  Constitutional: Negative for chills and fever.  Eyes: Negative for visual disturbance.  Respiratory: Negative for cough and shortness of breath.   Gastrointestinal: Positive for abdominal pain. Negative for constipation, diarrhea, nausea and vomiting.  Genitourinary: Positive for vaginal discharge. Negative for difficulty urinating, dysuria and vaginal bleeding.  Musculoskeletal: Positive for back pain.  Neurological: Negative for dizziness, light-headedness and headaches.   Physical Exam   Blood pressure 111/78, pulse 88, temperature 97.9 F (36.6 C), temperature source Oral, resp. rate 18, height 5\' 2"  (1.575 m), weight 94.3 kg, last menstrual period 08/25/2018.  Physical Exam   Constitutional: She appears well-developed and well-nourished.  HENT:  Head: Normocephalic and atraumatic.  Eyes: Conjunctivae are normal.  Cardiovascular: Normal rate.  Respiratory: Effort normal.  GI: Soft. There is no abdominal tenderness.  Genitourinary: Cervix exhibits no motion tenderness, no discharge and no friability.    Vaginal discharge (Moderate amt watery curdy white discharge.) present.     Genitourinary Comments: BME: Closed/Thick   Musculoskeletal:        General: Normal range of motion.     Cervical back: Normal range of motion.  Neurological: She is alert.  Skin: Skin is warm and dry.  Psychiatric: She has a normal mood and affect. Her behavior is normal.    Fetal Assessment 145 bpm, Mod Var, -Decels, +10x10 Accels Toco: Ctx Q3-37min  MAU Course  No results found for this or any previous visit (from the past 24 hour(s)). No results found.  MDM PE Labs:None EFM  Assessment and Plan  34 year old G2P1001  [facility] at [facility] Abdominal Pain Candidiasis of vagina   -Extensive discussion and education regarding female anatomy and fetal development.   *Patient informed that baby's umbilical cord is not attached to her belly button.   *Patient educated on how sensation of belly button tension is likely to to uterine growth in setting of adhesions from previous c/s.  *Patient shown visual examples of abdominal adhesions that can result from cesarean sections. *Reassured that adhesions are an anticipated and expected result of any abdominal surgery that requires the abdomen to be opened. *Further reassured that pain can be treated. -Patient expresses appreciation and understanding regarding education and reassurances. -Further discussed decrease in intensity of fetal movement as fetus grows. -Discussed usage of flexeril at home for increased comfort.  Patient agreeable and script sent to pharmacy on file. -Patient declines tylenol dosing stating that she  has tylenol at home and it doesn't work.  -Exam performed and findings discussed. -Informed that cervix closed and that treatment for Terazol cream was sent by office staff at earlier appt. -Informed of fetal tracing as reassuring, but not reactive. -Patient given ginger ale and placed on side lying position. -Will send for BPP if strip remains NR.   Maryann Conners MSN, CNM 05/04/2019, 5:27 AM    Reassessment (6:38 AM) -BPP ordered -Will await results.  Reassessment (7:07 AM) BPP 8/8  -Patient informed of results. -Instructed to keep appt as scheduled. -Encouraged to call or return to MAU if symptoms worsen or with the onset of new symptoms. -Discharged to home in stable condition.  Maryann Conners MSN, CNM Advanced  Practice Provider, [facility]

## 2019-05-10 ENCOUNTER — Other Ambulatory Visit (HOSPITAL_COMMUNITY)
Admission: RE | Admit: 2019-05-10 | Discharge: 2019-05-10 | Disposition: A | Discharge status: Home / Self Care | Payer: Medicaid Other | Source: Ambulatory Visit | Attending: Obstetrics & Gynecology | Admitting: Obstetrics & Gynecology

## 2019-05-10 ENCOUNTER — Other Ambulatory Visit: Payer: Self-pay

## 2019-05-10 ENCOUNTER — Ambulatory Visit (INDEPENDENT_AMBULATORY_CARE_PROVIDER_SITE_OTHER): Payer: Medicaid Other | Admitting: Obstetrics & Gynecology

## 2019-05-10 VITALS — BP 109/78 | HR 118 | Wt 207.0 lb

## 2019-05-10 DIAGNOSIS — Z3A36 36 weeks gestation of pregnancy: Secondary | ICD-10-CM

## 2019-05-10 DIAGNOSIS — O34219 Maternal care for unspecified type scar from previous cesarean delivery: Secondary | ICD-10-CM | POA: Insufficient documentation

## 2019-05-10 NOTE — Progress Notes (Signed)
Subjective:    Desiree Pearson is a 34 y.o. G2P1 [redacted]w[redacted]d being seen today for her obstetrical visit.  Patient reports no bleeding, no contractions, no cramping and no leaking. Fetal movement: normal.  Objective:    BP 109/78   Pulse (!) 118   Wt 93.9 kg   LMP 08/25/2018 (Within Days)   BMI 37.86 kg/m   Physical Exam  Vitals reviewed. Constitutional: She is oriented to person, place, and time. She appears well-developed and well-nourished.  HENT:  Head: Normocephalic and atraumatic.  Eyes: Pupils are equal, round, and reactive to light.  Cardiovascular: Normal rate.  Respiratory: Effort normal.  GI: Soft. She exhibits no distension. There is no abdominal tenderness. There is no guarding.  Musculoskeletal:        General: No edema.     Cervical back: Normal range of motion.  Neurological: She is alert and oriented to person, place, and time.  Skin: Skin is warm and dry.  Psychiatric: She has a normal mood and affect. Her behavior is normal.    Exam  FHT: Fetal Heart Rate (bpm): 140  Uterine Size:  37cm  Presentation:  cephalic     Assessment:    Pregnancy:  G2P1    Plan:    Patient Active Problem List   Diagnosis Date Noted  . Previous cesarean delivery affecting pregnancy, antepartum 04/19/2019  . Unwanted fertility 04/05/2019  . History of congenital heart defect 04/05/2019  . Thalassemia alpha carrier 01/04/2019  . History of postpartum depression, currently pregnant 10/30/2018  . Supervision of high risk pregnancy, antepartum 10/30/2018  . Wolff-Parkinson-White (WPW) syndrome   . Postural orthostatic tachycardia syndrome   . Depressive disorder, not elsewhere classified 03/12/2009    Pt desires BTL at time of ERCS. consent signed 03/08/19.  Follow up in 1 Week.

## 2019-05-10 NOTE — Patient Instructions (Signed)
Third Trimester of Pregnancy The third trimester is from week 28 through week 40 (months 7 through 9). The third trimester is a time when the unborn baby (fetus) is growing rapidly. At the end of the ninth month, the fetus is about 20 inches in length and weighs 6-10 pounds. Body changes during your third trimester Your body will continue to go through many changes during pregnancy. The changes vary from woman to woman. During the third trimester:  Your weight will continue to increase. You can expect to gain 25-35 pounds (11-16 kg) by the end of the pregnancy.  You may begin to get stretch marks on your hips, abdomen, and breasts.  You may urinate more often because the fetus is moving lower into your pelvis and pressing on your bladder.  You may develop or continue to have heartburn. This is caused by increased hormones that slow down muscles in the digestive tract.  You may develop or continue to have constipation because increased hormones slow digestion and cause the muscles that push waste through your intestines to relax.  You may develop hemorrhoids. These are swollen veins (varicose veins) in the rectum that can itch or be painful.  You may develop swollen, bulging veins (varicose veins) in your legs.  You may have increased body aches in the pelvis, back, or thighs. This is due to weight gain and increased hormones that are relaxing your joints.  You may have changes in your hair. These can include thickening of your hair, rapid growth, and changes in texture. Some women also have hair loss during or after pregnancy, or hair that feels dry or thin. Your hair will most likely return to normal after your baby is born.  Your breasts will continue to grow and they will continue to become tender. A yellow fluid (colostrum) may leak from your breasts. This is the first milk you are producing for your baby.  Your belly button may stick out.  You may notice more swelling in your hands,  face, or ankles.  You may have increased tingling or numbness in your hands, arms, and legs. The skin on your belly may also feel numb.  You may feel short of breath because of your expanding uterus.  You may have more problems sleeping. This can be caused by the size of your belly, increased need to urinate, and an increase in your body's metabolism.  You may notice the fetus "dropping," or moving lower in your abdomen (lightening).  You may have increased vaginal discharge.  You may notice your joints feel loose and you may have pain around your pelvic bone. What to expect at prenatal visits You will have prenatal exams every 2 weeks until week 36. Then you will have weekly prenatal exams. During a routine prenatal visit:  You will be weighed to make sure you and the baby are growing normally.  Your blood pressure will be taken.  Your abdomen will be measured to track your baby's growth.  The fetal heartbeat will be listened to.  Any test results from the previous visit will be discussed.  You may have a cervical check near your due date to see if your cervix has softened or thinned (effaced).  You will be tested for Group B streptococcus. This happens between 35 and 37 weeks. Your health care provider may ask you:  What your birth plan is.  How you are feeling.  If you are feeling the baby move.  If you have had any abnormal   symptoms, such as leaking fluid, bleeding, severe headaches, or abdominal cramping.  If you are using any tobacco products, including cigarettes, chewing tobacco, and electronic cigarettes.  If you have any questions. Other tests or screenings that may be performed during your third trimester include:  Blood tests that check for low iron levels (anemia).  Fetal testing to check the health, activity level, and growth of the fetus. Testing is done if you have certain medical conditions or if there are problems during the pregnancy.  Nonstress test  (NST). This test checks the health of your baby to make sure there are no signs of problems, such as the baby not getting enough oxygen. During this test, a belt is placed around your belly. The baby is made to move, and its heart rate is monitored during movement. What is false labor? False labor is a condition in which you feel small, irregular tightenings of the muscles in the womb (contractions) that usually go away with rest, changing position, or drinking water. These are called Braxton Hicks contractions. Contractions may last for hours, days, or even weeks before true labor sets in. If contractions come at regular intervals, become more frequent, increase in intensity, or become painful, you should see your health care provider. What are the signs of labor?  Abdominal cramps.  Regular contractions that start at 10 minutes apart and become stronger and more frequent with time.  Contractions that start on the top of the uterus and spread down to the lower abdomen and back.  Increased pelvic pressure and dull back pain.  A watery or bloody mucus discharge that comes from the vagina.  Leaking of amniotic fluid. This is also known as your "water breaking." It could be a slow trickle or a gush. Let your health care provider know if it has a color or strange odor. If you have any of these signs, call your health care provider right away, even if it is before your due date. Follow these instructions at home: Medicines  Follow your health care provider's instructions regarding medicine use. Specific medicines may be either safe or unsafe to take during pregnancy.  Take a prenatal vitamin that contains at least 600 micrograms (mcg) of folic acid.  If you develop constipation, try taking a stool softener if your health care provider approves. Eating and drinking   Eat a balanced diet that includes fresh fruits and vegetables, whole grains, good sources of protein such as meat, eggs, or tofu,  and low-fat dairy. Your health care provider will help you determine the amount of weight gain that is right for you.  Avoid raw meat and uncooked cheese. These carry germs that can cause birth defects in the baby.  If you have low calcium intake from food, talk to your health care provider about whether you should take a daily calcium supplement.  Eat four or five small meals rather than three large meals a day.  Limit foods that are high in fat and processed sugars, such as fried and sweet foods.  To prevent constipation: ? Drink enough fluid to keep your urine clear or pale yellow. ? Eat foods that are high in fiber, such as fresh fruits and vegetables, whole grains, and beans. Activity  Exercise only as directed by your health care provider. Most women can continue their usual exercise routine during pregnancy. Try to exercise for 30 minutes at least 5 days a week. Stop exercising if you experience uterine contractions.  Avoid heavy lifting.  Do   not exercise in extreme heat or humidity, or at high altitudes.  Wear low-heel, comfortable shoes.  Practice good posture.  You may continue to have sex unless your health care provider tells you otherwise. Relieving pain and discomfort  Take frequent breaks and rest with your legs elevated if you have leg cramps or low back pain.  Take warm sitz baths to soothe any pain or discomfort caused by hemorrhoids. Use hemorrhoid cream if your health care provider approves.  Wear a good support bra to prevent discomfort from breast tenderness.  If you develop varicose veins: ? Wear support pantyhose or compression stockings as told by your healthcare provider. ? Elevate your feet for 15 minutes, 3-4 times a day. Prenatal care  Write down your questions. Take them to your prenatal visits.  Keep all your prenatal visits as told by your health care provider. This is important. Safety  Wear your seat belt at all times when driving.  Make  a list of emergency phone numbers, including numbers for family, friends, the hospital, and police and fire departments. General instructions  Avoid cat litter boxes and soil used by cats. These carry germs that can cause birth defects in the baby. If you have a cat, ask someone to clean the litter box for you.  Do not travel far distances unless it is absolutely necessary and only with the approval of your health care provider.  Do not use hot tubs, steam rooms, or saunas.  Do not drink alcohol.  Do not use any products that contain nicotine or tobacco, such as cigarettes and e-cigarettes. If you need help quitting, ask your health care provider.  Do not use any medicinal herbs or unprescribed drugs. These chemicals affect the formation and growth of the baby.  Do not douche or use tampons or scented sanitary pads.  Do not cross your legs for long periods of time.  To prepare for the arrival of your baby: ? Take prenatal classes to understand, practice, and ask questions about labor and delivery. ? Make a trial run to the hospital. ? Visit the hospital and tour the maternity area. ? Arrange for maternity or paternity leave through employers. ? Arrange for family and friends to take care of pets while you are in the hospital. ? Purchase a rear-facing car seat and make sure you know how to install it in your car. ? Pack your hospital bag. ? Prepare the baby's nursery. Make sure to remove all pillows and stuffed animals from the baby's crib to prevent suffocation.  Visit your dentist if you have not gone during your pregnancy. Use a soft toothbrush to brush your teeth and be gentle when you floss. Contact a health care provider if:  You are unsure if you are in labor or if your water has broken.  You become dizzy.  You have mild pelvic cramps, pelvic pressure, or nagging pain in your abdominal area.  You have lower back pain.  You have persistent nausea, vomiting, or  diarrhea.  You have an unusual or bad smelling vaginal discharge.  You have pain when you urinate. Get help right away if:  Your water breaks before 37 weeks.  You have regular contractions less than 5 minutes apart before 37 weeks.  You have a fever.  You are leaking fluid from your vagina.  You have spotting or bleeding from your vagina.  You have severe abdominal pain or cramping.  You have rapid weight loss or weight gain.  You have   shortness of breath with chest pain.  You notice sudden or extreme swelling of your face, hands, ankles, feet, or legs.  Your baby makes fewer than 10 movements in 2 hours.  You have severe headaches that do not go away when you take medicine.  You have vision changes. Summary  The third trimester is from week 28 through week 40, months 7 through 9. The third trimester is a time when the unborn baby (fetus) is growing rapidly.  During the third trimester, your discomfort may increase as you and your baby continue to gain weight. You may have abdominal, leg, and back pain, sleeping problems, and an increased need to urinate.  During the third trimester your breasts will keep growing and they will continue to become tender. A yellow fluid (colostrum) may leak from your breasts. This is the first milk you are producing for your baby.  False labor is a condition in which you feel small, irregular tightenings of the muscles in the womb (contractions) that eventually go away. These are called Braxton Hicks contractions. Contractions may last for hours, days, or even weeks before true labor sets in.  Signs of labor can include: abdominal cramps; regular contractions that start at 10 minutes apart and become stronger and more frequent with time; watery or bloody mucus discharge that comes from the vagina; increased pelvic pressure and dull back pain; and leaking of amniotic fluid. This information is not intended to replace advice given to you by your  health care provider. Make sure you discuss any questions you have with your health care provider. Document Revised: 05/11/2018 Document Reviewed: 02/24/2016 Elsevier Patient Education  2020 [employer]  

## 2019-05-11 LAB — CERVICOVAGINAL ANCILLARY ONLY
Bacterial Vaginitis (gardnerella): NEGATIVE
Candida Glabrata: NEGATIVE
Candida Vaginitis: NEGATIVE
Chlamydia: NEGATIVE
Comment: NEGATIVE
Comment: NEGATIVE
Comment: NEGATIVE
Comment: NEGATIVE
Comment: NEGATIVE
Comment: NORMAL
Neisseria Gonorrhea: NEGATIVE
Trichomonas: NEGATIVE

## 2019-05-12 LAB — STREP GP B NAA: Strep Gp B NAA: NEGATIVE

## 2019-05-16 ENCOUNTER — Other Ambulatory Visit: Payer: Self-pay

## 2019-05-16 ENCOUNTER — Ambulatory Visit (INDEPENDENT_AMBULATORY_CARE_PROVIDER_SITE_OTHER): Payer: Medicaid Other | Admitting: Advanced Practice Midwife

## 2019-05-16 ENCOUNTER — Inpatient Hospital Stay (HOSPITAL_COMMUNITY)
Admission: AD | Admit: 2019-05-16 | Discharge: 2019-05-16 | Disposition: A | Discharge status: Home / Self Care | Payer: Medicaid Other | Attending: Obstetrics and Gynecology | Admitting: Obstetrics and Gynecology

## 2019-05-16 ENCOUNTER — Encounter (HOSPITAL_COMMUNITY): Payer: Self-pay | Admitting: Obstetrics and Gynecology

## 2019-05-16 VITALS — BP 129/81 | HR 92 | Wt 207.0 lb

## 2019-05-16 DIAGNOSIS — Z8249 Family history of ischemic heart disease and other diseases of the circulatory system: Secondary | ICD-10-CM | POA: Insufficient documentation

## 2019-05-16 DIAGNOSIS — Z882 Allergy status to sulfonamides status: Secondary | ICD-10-CM | POA: Insufficient documentation

## 2019-05-16 DIAGNOSIS — O368130 Decreased fetal movements, third trimester, not applicable or unspecified: Secondary | ICD-10-CM | POA: Diagnosis not present

## 2019-05-16 DIAGNOSIS — O0990 Supervision of high risk pregnancy, unspecified, unspecified trimester: Secondary | ICD-10-CM

## 2019-05-16 DIAGNOSIS — Z833 Family history of diabetes mellitus: Secondary | ICD-10-CM | POA: Insufficient documentation

## 2019-05-16 DIAGNOSIS — Z3A37 37 weeks gestation of pregnancy: Secondary | ICD-10-CM | POA: Diagnosis not present

## 2019-05-16 DIAGNOSIS — O34219 Maternal care for unspecified type scar from previous cesarean delivery: Secondary | ICD-10-CM

## 2019-05-16 DIAGNOSIS — Z885 Allergy status to narcotic agent status: Secondary | ICD-10-CM | POA: Insufficient documentation

## 2019-05-16 DIAGNOSIS — Z3689 Encounter for other specified antenatal screening: Secondary | ICD-10-CM | POA: Insufficient documentation

## 2019-05-16 DIAGNOSIS — Z8659 Personal history of other mental and behavioral disorders: Secondary | ICD-10-CM

## 2019-05-16 DIAGNOSIS — O99891 Other specified diseases and conditions complicating pregnancy: Secondary | ICD-10-CM

## 2019-05-16 LAB — URINALYSIS, ROUTINE W REFLEX MICROSCOPIC
Bilirubin Urine: NEGATIVE
Glucose, UA: NEGATIVE mg/dL
Hgb urine dipstick: NEGATIVE
Ketones, ur: NEGATIVE mg/dL
Nitrite: NEGATIVE
Protein, ur: 30 mg/dL — AB
Specific Gravity, Urine: 1.017 (ref 1.005–1.030)
pH: 6 (ref 5.0–8.0)

## 2019-05-16 NOTE — MAU Note (Signed)
Pt reports feeling weak and short of breath in reclining position. Patient positioned on side with pillow and HOB elevated. Gave patient apple juice to elicit fetal movement. Patient also given button to push when she feels fetal movement. Explained movement types to patient and she indicated understanding of this.

## 2019-05-16 NOTE — MAU Note (Signed)
.   Desiree Pearson is a 34 y.o. at [redacted]w[redacted]d here in MAU reporting: she went to the office for her appointment and had a non reactive NST. Denies any VB or LOF or pain  Onset of complaint: sent from office Pain score:0 Vitals:   05/16/19 1016  BP: (!) 104/58  Pulse: (!) 118  Resp: 16  Temp: 98.7 F (37.1 C)  SpO2: 100%     FHT:135 Lab orders placed from triage: [facility]

## 2019-05-16 NOTE — Progress Notes (Signed)
   PRENATAL VISIT NOTE  Subjective:  Desiree Pearson is a 34 y.o. G2P1 at [redacted]w[redacted]d being seen today for ongoing prenatal care.  She is currently monitored for the following issues for this high-risk pregnancy and has Depressive disorder, not elsewhere classified; History of postpartum depression, currently pregnant; Supervision of high risk pregnancy, antepartum; Wolff-Parkinson-White (WPW) syndrome; Postural orthostatic tachycardia syndrome; Thalassemia alpha carrier; Unwanted fertility; History of congenital heart defect; and Previous cesarean delivery affecting pregnancy, antepartum on their problem list.  Patient reports decreased fetal movement, dizziness and nausea.  Contractions: Irritability. Vag. Bleeding: None.  Movement: Present. Denies leaking of fluid.   The following portions of the patient's history were reviewed and updated as appropriate: allergies, current medications, past family history, past medical history, past social history, past surgical history and problem list.   Objective:   Vitals:   05/16/19 0823  BP: 129/81  Pulse: 92  Weight: 207 lb (93.9 kg)    Fetal Status: Fetal Heart Rate (bpm): 147   Movement: Present     General:  Alert, oriented and cooperative. Patient is in no acute distress.  Skin: Skin is warm and dry. No rash noted.   Cardiovascular: HEART: normal rate, heart sounds, regular rhythm RESP: normal effort, lung sounds clear and equal bilaterally   Respiratory: See above  Abdomen: Soft, gravid, appropriate for gestational age.  Pain/Pressure: Present     Pelvic: Cervical exam deferred        Extremities: Normal range of motion.  Edema: None  Mental Status: Normal mood and affect. Normal behavior. Normal judgment and thought content.   Assessment and Plan:  Pregnancy: G2P1 at [redacted]w[redacted]d 1. Previous cesarean delivery, antepartum condition or complication --Desires repeat and BTL, scheduled at 39 weeks  2. History of postpartum depression, currently  pregnant --stable, no meds  3. Supervision of high risk pregnancy, antepartum --Anticipatory guidance about next visits/weeks of pregnancy given. --Next visit scheduled at 38 weeks, in office at pt request and due to today's symptoms. Pt to MAU from the office today for further evaluation (see below).  4. Decreased fetal movements in third trimester, single or unspecified fetus --Pt reports decreased movement x 2 weeks, since previous MAU visit.  She reports even after sugar, PO fluids, food, the baby does not move much.   --NST attempted in the office but pt could not tolerate reclining position or sidelying on office table and continuous FHR tracing not obtained.  Fetal movement visualized and FHR intermittent tracing with baseline 145 and moderate variability. --Pt continues to report nausea/dizziness after NST attempted --To MAU for further fetal and maternal assessment  Term labor symptoms and general obstetric precautions including but not limited to vaginal bleeding, contractions, leaking of fluid and fetal movement were reviewed in detail with the patient. Please refer to After Visit Summary for other counseling recommendations.   Return in about 1 week (around 05/23/2019).  Future Appointments  Date Time Provider [place]  05/22/2019 10:15 AM Woodroe Mode, MD [place] None    Fatima Blank, CNM

## 2019-05-16 NOTE — MAU Provider Note (Addendum)
Chief Complaint  Patient presents with  . Decreased Fetal Movement   Desiree Pearson is a 34 y.o. G2P1 who presents to the MAU for decreased fetal movement for the past two weeks, new onset pelvic pain while laying on her back and left side, and general feelings of fatigue and weakness. She reports feeling some fetal movement in the last two weeks, including today, but not as much as the baby normally does and grew concerned. She first reported to [facility] who preformed a NST and found fetal movement but could not get patient comfortable in a position that allowed them to trace the baby. So she was sent here for NST.   The pelvic pain started two days ago and happens only when she is laying on her left side or supine. She reports it resolves when she shifts into a different position. It is accompanied by shortness of breath, and chest tightness/discomfort which resolve upon position change. She also reports feelings of fatigue and weakness and has not been sleeping well. She says she has to constantly shift positions and cannot stay comfortable in one position for long. She says she has not experienced any fever or chills, nausea, vomiting, new vaginal discharge, or changes in urination or bowel movements. She denies being sick recently or recent sick contacts.   OB History     Gravida  2   Para  1   Term      Preterm      AB      Living  1      SAB      TAB      Ectopic      Multiple      Live Births  1           Past Medical History:  Diagnosis Date  . Chlamydia infection affecting pregnancy 11/29/2018  . Postural orthostatic tachycardia syndrome   . Wolff-Parkinson-White (WPW) syndrome     Past Surgical History:  Procedure Laterality Date  . ABLATION  2006   for Wolf parkinson white disease  . CESAREAN SECTION      Family History  Problem Relation Age of Onset  . Diabetes Father   . Hypertension Father     Social History   Tobacco Use  . Smoking status:  Never Smoker  . Smokeless tobacco: Never Used  Substance Use Topics  . Alcohol use: Not Currently    Comment: occasionally wine  . Drug use: Never    Allergies:  Allergies  Allergen Reactions  . Morphine Rash and Other (See Comments)    Heart rate dropped  . Sulfur Rash    Medications Prior to Admission  Medication Sig Dispense Refill Last Dose  . cyclobenzaprine (FLEXERIL) 10 MG tablet Take 1 tablet (10 mg total) by mouth 2 (two) times daily as needed for muscle spasms. 10 tablet 0 Past Month at Unknown time  . folic acid (FOLVITE) 1 MG tablet Take 1 mg by mouth daily.   05/16/2019 at Unknown time  . Prenatal Vit-Fe Fumarate-FA (PRENATAL VITAMINS PO) Take 1 tablet by mouth daily.   05/16/2019 at Unknown time  . Blood Pressure Monitoring DEVI 1 Device by Does not apply route once a week. (Patient not taking: Reported on 05/03/2019) 1 Device 0   . Misc. Devices (MEDELA DOUBLE BREAST PUMP) MISC 1 Device by Does not apply route as needed. 1 each 0   . Misc. Devices MISC Dispense one maternity belt for patient (Patient not taking: Reported  on 04/19/2019) 1 each 0     Review of Systems  Constitutional: Positive for fatigue. Negative for chills and fever.  Respiratory: Positive for chest tightness and shortness of breath.        Only when laying on left side or supine.  Gastrointestinal: Negative for abdominal pain, constipation, diarrhea, nausea and vomiting.  Genitourinary: Positive for pelvic pain (When laying on left side or supine.). Negative for difficulty urinating, dysuria, frequency, vaginal bleeding, vaginal discharge and vaginal pain.  Neurological: Positive for weakness. Negative for dizziness.   Physical Exam Blood pressure (!) 104/58, pulse (!) 118, temperature 98.7 F (37.1 C), resp. rate 16, last menstrual period 08/25/2018, SpO2 99 %. Physical Exam  Constitutional: She is cooperative. She does not appear ill. No distress.  Neck: No JVD present. No tracheal deviation  present.  Cardiovascular: Normal rate, regular rhythm and normal heart sounds. Exam reveals no gallop and no friction rub.  No murmur heard. Respiratory: Effort normal and breath sounds normal. No stridor. No respiratory distress. She has no wheezes. She has no rales.  Genitourinary:    Genitourinary Comments: Gravid uterus    Neurological: She is alert.  Skin: Skin is warm and dry.  Psychiatric: She has a normal mood and affect.    MAU Course / MDM Procedures   UA positive for protein and leukocytes with rare bacteria, dirty catch likely as there were 21-50 squamous epithelial cells present.   Pt was given juice to stimulate the baby and attached to a fetal monitoring device which showed reactive NST: 140bpm, moderate variability, >2  15 x 15 accelerations in 20 minutes, and no decelerations. Pt also reports fetal movement while in room.  NST:  Baseline: 140 Variability: moderate Accels: 15x15 Decels: none Toco: none Reactive NST    Assessment/Plan Desiree Pearson is a 34 y.o. in stable condition who presented with concerns for decreased fetal movement and new onset pelvic pain while supine or laying on her left side. Fatigue and and pelvic pain is likely due to pregnancy related sleep disruption in her 3rd trimester. NST was reactive. Pt was reassured about her baby's movements but should continue counting the baby's movements.  1. Decreased fetal movements in third trimester, single or unspecified fetus   2. NST (non-stress test) reactive   3. [redacted] weeks gestation of pregnancy    DC home Comfort measures reviewed  3rd Trimester precautions  Labor precautions  Fetal kick counts RX: none  Return to MAU as needed FU with OB as planned  Follow-up Information    [place] Follow up.   Contact information: [address] [place] [address] [phone] [place], [place] OF STUDENT NOTE    I personally evaluated this patient along with the student, and verified all aspects of the history, physical exam, and medical decision making as documented by the student. I agree with the student's documentation and have made all necessary edits.   Lyndee Hensen, DO PGY-1, [facility] 05/16/2019 12:11 PM     Attestation of Supervision of Student:  I confirm that I have verified the information documented in the resident's  note and that I have also personally reperformed the history, physical exam and all medical decision making activities.  I have verified that all services and findings are accurately documented in this student's note; and I agree with management and plan as outlined in  the documentation. I have also made any necessary editorial changes.  Fundal height: 38 cm   Marcille Buffy DNP, CNM  05/16/19  2:21 PM  [facility]

## 2019-05-16 NOTE — Discharge Instructions (Signed)
Fetal Movement Counts Patient Name: ________________________________________________ Patient Due Date: ____________________ What is a fetal movement count?  A fetal movement count is the number of times that you feel your baby move during a certain amount of time. This may also be called a fetal kick count. A fetal movement count is recommended for every pregnant woman. You may be asked to start counting fetal movements as early as week 28 of your pregnancy. Pay attention to when your baby is most active. You may notice your baby's sleep and wake cycles. You may also notice things that make your baby move more. You should do a fetal movement count:  When your baby is normally most active.  At the same time each day. A good time to count movements is while you are resting, after having something to eat and drink. How do I count fetal movements? 1. Find a quiet, comfortable area. Sit, or lie down on your side. 2. Write down the date, the start time and stop time, and the number of movements that you felt between those two times. Take this information with you to your health care visits. 3. Write down your start time when you feel the first movement. 4. Count kicks, flutters, swishes, rolls, and jabs. You should feel at least 10 movements. 5. You may stop counting after you have felt 10 movements, or if you have been counting for 2 hours. Write down the stop time. 6. If you do not feel 10 movements in 2 hours, contact your health care provider for further instructions. Your health care provider may want to do additional tests to assess your baby's well-being. Contact a health care provider if:  You feel fewer than 10 movements in 2 hours.  Your baby is not moving like he or she usually does. Date: ____________ Start time: ____________ Stop time: ____________ Movements: ____________ Date: ____________ Start time: ____________ Stop time: ____________ Movements: ____________ Date: ____________  Start time: ____________ Stop time: ____________ Movements: ____________ Date: ____________ Start time: ____________ Stop time: ____________ Movements: ____________ Date: ____________ Start time: ____________ Stop time: ____________ Movements: ____________ Date: ____________ Start time: ____________ Stop time: ____________ Movements: ____________ Date: ____________ Start time: ____________ Stop time: ____________ Movements: ____________ Date: ____________ Start time: ____________ Stop time: ____________ Movements: ____________ Date: ____________ Start time: ____________ Stop time: ____________ Movements: ____________ This information is not intended to replace advice given to you by your health care provider. Make sure you discuss any questions you have with your health care provider. Document Revised: 09/07/2018 Document Reviewed: 09/07/2018 Elsevier Patient Education  2020 Elsevier Inc.  

## 2019-05-16 NOTE — Patient Instructions (Signed)
Labor Precautions Reasons to come to MAU at [facility]:  1.  Contractions are  5 minutes apart or less, each last 1 minute, these have been going on for 1-2 hours, and you cannot walk or talk during them 2.  You have a large gush of fluid, or a trickle of fluid that will not stop and you have to wear a pad 3.  You have bleeding that is bright red, heavier than spotting--like menstrual bleeding (spotting can be normal in early labor or after a check of your cervix) 4.  You do not feel the baby moving like he/she normally does  

## 2019-05-22 ENCOUNTER — Other Ambulatory Visit: Payer: Self-pay

## 2019-05-22 ENCOUNTER — Encounter (HOSPITAL_COMMUNITY): Payer: Self-pay

## 2019-05-22 ENCOUNTER — Ambulatory Visit (INDEPENDENT_AMBULATORY_CARE_PROVIDER_SITE_OTHER): Payer: Medicaid Other | Admitting: Obstetrics & Gynecology

## 2019-05-22 VITALS — BP 118/82 | HR 98 | Wt 206.0 lb

## 2019-05-22 DIAGNOSIS — O34219 Maternal care for unspecified type scar from previous cesarean delivery: Secondary | ICD-10-CM

## 2019-05-22 DIAGNOSIS — O0990 Supervision of high risk pregnancy, unspecified, unspecified trimester: Secondary | ICD-10-CM

## 2019-05-22 DIAGNOSIS — Z3009 Encounter for other general counseling and advice on contraception: Secondary | ICD-10-CM

## 2019-05-22 DIAGNOSIS — Z3A38 38 weeks gestation of pregnancy: Secondary | ICD-10-CM

## 2019-05-22 NOTE — Patient Instructions (Signed)
Cesarean Delivery Cesarean birth, or cesarean delivery, is the surgical delivery of a baby through an incision in the abdomen and the uterus. This may be referred to as a C-section. This procedure may be scheduled ahead of time, or it may be done in an emergency situation. Tell a health care provider about:  Any allergies you have.  All medicines you are taking, including vitamins, herbs, eye drops, creams, and over-the-counter medicines.  Any problems you or family members have had with anesthetic medicines.  Any blood disorders you have.  Any surgeries you have had.  Any medical conditions you have.  Whether you or any members of your family have a history of deep vein thrombosis (DVT) or pulmonary embolism (PE). What are the risks? Generally, this is a safe procedure. However, problems may occur, including:  Infection.  Bleeding.  Allergic reactions to medicines.  Damage to other structures or organs.  Blood clots.  Injury to your baby. What happens before the procedure? General instructions  Follow instructions from your health care provider about eating or drinking restrictions.  If you know that you are going to have a cesarean delivery, do not shave your pubic area. Shaving before the procedure may increase your risk of infection.  Plan to have someone take you home from the hospital.  Ask your health care provider what steps will be taken to prevent infection. These may include: ? Removing hair at the surgery site. ? Washing skin with a germ-killing soap. ? Taking antibiotic medicine.  Depending on the reason for your cesarean delivery, you may have a physical exam or additional testing, such as an ultrasound.  You may have your blood or urine tested. Questions for your health care provider  Ask your health care provider about: ? Changing or stopping your regular medicines. This is especially important if you are taking diabetes medicines or blood  thinners. ? Your pain management plan. This is especially important if you plan to breastfeed your baby. ? How long you will be in the hospital after the procedure. ? Any concerns you may have about receiving blood products, if you need them during the procedure. ? Cord blood banking, if you plan to collect your baby's umbilical cord blood.  You may also want to ask your health care provider: ? Whether you will be able to hold or breastfeed your baby while you are still in the operating room. ? Whether your baby can stay with you immediately after the procedure and during your recovery. ? Whether a family member or a person of your choice can go with you into the operating room and stay with you during the procedure, immediately after the procedure, and during your recovery. What happens during the procedure?   An IV will be inserted into one of your veins.  Fluid and medicines, such as antibiotics, will be given before the surgery.  Fetal monitors will be placed on your abdomen to check your baby's heart rate.  You may be given a special warming gown to wear to keep your temperature stable.  A catheter may be inserted into your bladder through your urethra. This drains your urine during the procedure.  You may be given one or more of the following: ? A medicine to numb the area (local anesthetic). ? A medicine to make you fall asleep (general anesthetic). ? A medicine (regional anesthetic) that is injected into your back or through a small thin tube placed in your back (spinal anesthetic or epidural anesthetic).   This numbs everything below the injection site and allows you to stay awake during your procedure. If this makes you feel nauseous, tell your health care provider. Medicines will be available to help reduce any nausea you may feel.  An incision will be made in your abdomen, and then in your uterus.  If you are awake during your procedure, you may feel tugging and pulling in  your abdomen, but you should not feel pain. If you feel pain, tell your health care provider immediately.  Your baby will be removed from your uterus. You may feel more pressure or pushing while this happens.  Immediately after birth, your baby will be dried and kept warm. You may be able to hold and breastfeed your baby.  The umbilical cord may be clamped and cut during this time. This usually occurs after waiting a period of 1-2 minutes after delivery.  Your placenta will be removed from your uterus.  Your incisions will be closed with stitches (sutures). Staples, skin glue, or adhesive strips may also be applied to the incision in your abdomen.  Bandages (dressings) may be placed over the incision in your abdomen. The procedure may vary among health care providers and hospitals. What happens after the procedure?  Your blood pressure, heart rate, breathing rate, and blood oxygen level will be monitored until you are discharged from the hospital.  You may continue to receive fluids and medicines through an IV.  You will have some pain. Medicines will be available to help control your pain.  To help prevent blood clots: ? You may be given medicines. ? You may have to wear compression stockings or devices. ? You will be encouraged to walk around when you are able.  Hospital staff will encourage and support bonding with your baby. Your hospital may have you and your baby to stay in the same room (rooming in) during your hospital stay to encourage successful bonding and breastfeeding.  You may be encouraged to cough and breathe deeply often. This helps to prevent lung problems.  If you have a catheter draining your urine, it will be removed as soon as possible after your procedure. Summary  Cesarean birth, or cesarean delivery, is the surgical delivery of a baby through an incision in the abdomen and the uterus.  Follow instructions from your health care provider about eating or  drinking restrictions before the procedure.  You will have some pain after the procedure. Medicines will be available to help control your pain.  Hospital staff will encourage and support bonding with your baby after the procedure. Your hospital may have you and your baby to stay in the same room (rooming in) during your hospital stay to encourage successful bonding and breastfeeding. This information is not intended to replace advice given to you by your health care provider. Make sure you discuss any questions you have with your health care provider. Document Revised: 07/25/2017 Document Reviewed: 07/25/2017 Elsevier Patient Education  2020 Elsevier Inc.  

## 2019-05-22 NOTE — Progress Notes (Signed)
   PRENATAL VISIT NOTE  Subjective:  Desiree Pearson is a 34 y.o. G2P1 at [redacted]w[redacted]d being seen today for ongoing prenatal care.  She is currently monitored for the following issues for this high-risk pregnancy and has Depressive disorder, not elsewhere classified; History of postpartum depression, currently pregnant; Supervision of high risk pregnancy, antepartum; Wolff-Parkinson-White (WPW) syndrome; Postural orthostatic tachycardia syndrome; Thalassemia alpha carrier; Unwanted fertility; History of congenital heart defect; and Previous cesarean delivery affecting pregnancy, antepartum on their problem list.  Patient reports no complaints.  Contractions: Irregular. Vag. Bleeding: None.  Movement: Present. Denies leaking of fluid.   The following portions of the patient's history were reviewed and updated as appropriate: allergies, current medications, past family history, past medical history, past social history, past surgical history and problem list.   Objective:   Vitals:   05/22/19 1006  BP: 118/82  Pulse: 98  Weight: 206 lb (93.4 kg)    Fetal Status: Fetal Heart Rate (bpm): 145   Movement: Present     General:  Alert, oriented and cooperative. Patient is in no acute distress.  Skin: Skin is warm and dry. No rash noted.   Cardiovascular: Normal heart rate noted  Respiratory: Normal respiratory effort, no problems with respiration noted  Abdomen: Soft, gravid, appropriate for gestational age.  Pain/Pressure: Present     Pelvic: Cervical exam deferred        Extremities: Normal range of motion.     Mental Status: Normal mood and affect. Normal behavior. Normal judgment and thought content.   Assessment and Plan:  Pregnancy: G2P1 at [redacted]w[redacted]d 1. Supervision of high risk pregnancy, antepartum Doing well  2. Previous cesarean delivery affecting pregnancy, antepartum Need to reschedule RCS and BTL to 39 weeks on 4/23, currently set for 4/30.Message sent to J. Battle  3. Unwanted  fertility BTL  Term labor symptoms and general obstetric precautions including but not limited to vaginal bleeding, contractions, leaking of fluid and fetal movement were reviewed in detail with the patient. Please refer to After Visit Summary for other counseling recommendations.   Return in about 4 weeks (around 06/19/2019) for postpartum.  No future appointments.  Emeterio Reeve, MD

## 2019-05-22 NOTE — Patient Instructions (Addendum)
Desiree Pearson  05/22/2019   Your procedure is scheduled on:  05/25/2019  Arrive at will call at Entrance C on [address] at [facility]  and [address]. You are invited to use the FREE valet parking or use the Visitor's parking deck.  Pick up the phone at the desk and dial [phone].  Call this number if you have problems the morning of surgery: [phone]  Remember:   Do not eat food:(After Midnight) Desps de medianoche.  Do not drink clear liquids: (After Midnight) Desps de medianoche.  Take these medicines the morning of surgery with A SIP OF WATER:  none   Do not wear jewelry, make-up or nail polish.  Do not wear lotions, powders, or perfumes. Do not wear deodorant.  Do not shave 48 hours prior to surgery.  Do not bring valuables to the hospital.  [facility] is not   responsible for any belongings or valuables brought to the hospital.  Contacts, dentures or bridgework may not be worn into surgery.  Leave suitcase in the car. After surgery it may be brought to your room.  For patients admitted to the hospital, checkout time is 11:00 AM the day of              discharge.      Please read over the following fact sheets that you were given:     Preparing for Surgery

## 2019-05-22 NOTE — Addendum Note (Signed)
Addended by: Woodroe Mode on: 05/22/2019 03:01 PM   Modules accepted: Orders

## 2019-05-23 ENCOUNTER — Other Ambulatory Visit (HOSPITAL_COMMUNITY)
Admission: RE | Admit: 2019-05-23 | Discharge: 2019-05-23 | Disposition: A | Discharge status: Home / Self Care | Payer: Medicaid Other | Source: Ambulatory Visit | Attending: Obstetrics and Gynecology | Admitting: Obstetrics and Gynecology

## 2019-05-23 DIAGNOSIS — Z01812 Encounter for preprocedural laboratory examination: Secondary | ICD-10-CM | POA: Insufficient documentation

## 2019-05-23 DIAGNOSIS — Z20822 Contact with and (suspected) exposure to covid-19: Secondary | ICD-10-CM | POA: Insufficient documentation

## 2019-05-23 LAB — COMPREHENSIVE METABOLIC PANEL
ALT: 16 U/L (ref 0–44)
AST: 20 U/L (ref 15–41)
Albumin: 2.9 g/dL — ABNORMAL LOW (ref 3.5–5.0)
Alkaline Phosphatase: 196 U/L — ABNORMAL HIGH (ref 38–126)
Anion gap: 10 (ref 5–15)
BUN: <5 mg/dL — ABNORMAL LOW (ref 6–20)
CO2: 17 mmol/L — ABNORMAL LOW (ref 22–32)
Calcium: 8.9 mg/dL (ref 8.9–10.3)
Chloride: 103 mmol/L (ref 98–111)
Creatinine, Ser: 0.6 mg/dL (ref 0.44–1.00)
GFR calc Af Amer: >60 mL/min (ref >60)
GFR calc non Af Amer: >60 mL/min (ref >60)
Glucose, Bld: 119 mg/dL — ABNORMAL HIGH (ref 70–99)
Potassium: 3.7 mmol/L (ref 3.5–5.1)
Sodium: 130 mmol/L — ABNORMAL LOW (ref 135–145)
Total Bilirubin: 0.4 mg/dL (ref 0.3–1.2)
Total Protein: 6.7 g/dL (ref 6.5–8.1)

## 2019-05-23 LAB — CBC
HCT: 39.3 % (ref 36.0–46.0)
Hemoglobin: 11.8 g/dL — ABNORMAL LOW (ref 12.0–15.0)
MCH: 23 pg — ABNORMAL LOW (ref 26.0–34.0)
MCHC: 30 g/dL (ref 30.0–36.0)
MCV: 76.5 fL — ABNORMAL LOW (ref 80.0–100.0)
Platelets: 153 10*3/uL (ref 150–400)
RBC: 5.14 MIL/uL — ABNORMAL HIGH (ref 3.87–5.11)
RDW: 16 % — ABNORMAL HIGH (ref 11.5–15.5)
WBC: 6.4 10*3/uL (ref 4.0–10.5)
nRBC: 0 % (ref 0.0–0.2)

## 2019-05-23 LAB — TYPE AND SCREEN
ABO/RH(D): AB POS
Antibody Screen: NEGATIVE

## 2019-05-23 LAB — SARS CORONAVIRUS 2 (TAT 6-24 HRS): SARS Coronavirus 2: NEGATIVE

## 2019-05-23 LAB — RPR: RPR Ser Ql: NONREACTIVE

## 2019-05-23 LAB — ABO/RH: ABO/RH(D): AB POS

## 2019-05-23 NOTE — MAU Note (Signed)
Swab collected. Lab called

## 2019-05-24 ENCOUNTER — Encounter (HOSPITAL_COMMUNITY): Payer: Self-pay | Admitting: Obstetrics & Gynecology

## 2019-05-24 NOTE — Anesthesia Preprocedure Evaluation (Signed)
Anesthesia Evaluation  Patient identified by MRN, date of birth, ID band Patient awake    Reviewed: Allergy & Precautions, NPO status , Patient's Chart, lab work & pertinent test results  Airway Mallampati: II  TM Distance: >3 FB Neck ROM: Full    Dental no notable dental hx.    Pulmonary neg pulmonary ROS,    Pulmonary exam normal breath sounds clear to auscultation       Cardiovascular Normal cardiovascular exam+ dysrhythmias  Rhythm:Regular Rate:Normal  POTS WPW s/p ablation in 2006   Neuro/Psych negative neurological ROS  negative psych ROS   GI/Hepatic negative GI ROS, Neg liver ROS,   Endo/Other  Obesity BMI 38  Renal/GU negative Renal ROS  negative genitourinary   Musculoskeletal negative musculoskeletal ROS (+)   Abdominal   Peds negative pediatric ROS (+)  Hematology negative hematology ROS (+) hct 39.3, plt 153   Anesthesia Other Findings   Reproductive/Obstetrics (+) Pregnancy 1 prior c section                             Anesthesia Physical Anesthesia Plan  ASA: III  Anesthesia Plan: Spinal   Post-op Pain Management:    Induction:   PONV Risk Score and Plan: 3 and Ondansetron, Dexamethasone and Treatment may vary due to age or medical condition  Airway Management Planned: Natural Airway and Nasal Cannula  Additional Equipment: None  Intra-op Plan:   Post-operative Plan:   Informed Consent: I have reviewed the patients History and Physical, chart, labs and discussed the procedure including the risks, benefits and alternatives for the proposed anesthesia with the patient or authorized representative who has indicated his/her understanding and acceptance.       Plan Discussed with: CRNA  Anesthesia Plan Comments:         Anesthesia Quick Evaluation

## 2019-05-25 ENCOUNTER — Encounter (HOSPITAL_COMMUNITY)
Admission: RE | Disposition: A | Discharge status: Home / Self Care | Payer: Self-pay | Source: Home / Self Care | Attending: Obstetrics and Gynecology

## 2019-05-25 ENCOUNTER — Inpatient Hospital Stay (HOSPITAL_COMMUNITY): Payer: Medicaid Other

## 2019-05-25 ENCOUNTER — Other Ambulatory Visit: Payer: Self-pay

## 2019-05-25 ENCOUNTER — Inpatient Hospital Stay (HOSPITAL_COMMUNITY)
Admission: RE | Admit: 2019-05-25 | Discharge: 2019-05-28 | DRG: 783 | Disposition: A | Discharge status: Home / Self Care | Payer: Medicaid Other | Attending: Obstetrics and Gynecology | Admitting: Obstetrics and Gynecology

## 2019-05-25 ENCOUNTER — Encounter (HOSPITAL_COMMUNITY): Payer: Self-pay | Admitting: Obstetrics and Gynecology

## 2019-05-25 DIAGNOSIS — E669 Obesity, unspecified: Secondary | ICD-10-CM | POA: Diagnosis not present

## 2019-05-25 DIAGNOSIS — Z8659 Personal history of other mental and behavioral disorders: Secondary | ICD-10-CM

## 2019-05-25 DIAGNOSIS — O99214 Obesity complicating childbirth: Secondary | ICD-10-CM | POA: Diagnosis not present

## 2019-05-25 DIAGNOSIS — O9942 Diseases of the circulatory system complicating childbirth: Secondary | ICD-10-CM | POA: Diagnosis present

## 2019-05-25 DIAGNOSIS — D259 Leiomyoma of uterus, unspecified: Secondary | ICD-10-CM | POA: Diagnosis not present

## 2019-05-25 DIAGNOSIS — Z3A39 39 weeks gestation of pregnancy: Secondary | ICD-10-CM

## 2019-05-25 DIAGNOSIS — O26893 Other specified pregnancy related conditions, third trimester: Secondary | ICD-10-CM | POA: Diagnosis not present

## 2019-05-25 DIAGNOSIS — O3413 Maternal care for benign tumor of corpus uteri, third trimester: Secondary | ICD-10-CM | POA: Diagnosis not present

## 2019-05-25 DIAGNOSIS — Z302 Encounter for sterilization: Secondary | ICD-10-CM | POA: Diagnosis not present

## 2019-05-25 DIAGNOSIS — D563 Thalassemia minor: Secondary | ICD-10-CM | POA: Diagnosis not present

## 2019-05-25 DIAGNOSIS — Z20822 Contact with and (suspected) exposure to covid-19: Secondary | ICD-10-CM | POA: Diagnosis not present

## 2019-05-25 DIAGNOSIS — I456 Pre-excitation syndrome: Secondary | ICD-10-CM | POA: Diagnosis not present

## 2019-05-25 DIAGNOSIS — O0990 Supervision of high risk pregnancy, unspecified, unspecified trimester: Secondary | ICD-10-CM

## 2019-05-25 DIAGNOSIS — I498 Other specified cardiac arrhythmias: Secondary | ICD-10-CM | POA: Diagnosis present

## 2019-05-25 DIAGNOSIS — Z3009 Encounter for other general counseling and advice on contraception: Secondary | ICD-10-CM | POA: Diagnosis present

## 2019-05-25 DIAGNOSIS — O34211 Maternal care for low transverse scar from previous cesarean delivery: Principal | ICD-10-CM | POA: Diagnosis present

## 2019-05-25 DIAGNOSIS — Z98891 History of uterine scar from previous surgery: Secondary | ICD-10-CM

## 2019-05-25 DIAGNOSIS — O34219 Maternal care for unspecified type scar from previous cesarean delivery: Secondary | ICD-10-CM | POA: Diagnosis present

## 2019-05-25 DIAGNOSIS — G90A Postural orthostatic tachycardia syndrome (POTS): Secondary | ICD-10-CM | POA: Diagnosis present

## 2019-05-25 SURGERY — Surgical Case
Anesthesia: Spinal

## 2019-05-25 MED ORDER — NALOXONE HCL 4 MG/10ML IJ SOLN
1.0000 ug/kg/h | INTRAVENOUS | Status: DC | PRN
  Filled 2019-05-25: qty 5

## 2019-05-25 MED ORDER — COCONUT OIL OIL: 1.0000 "application " | TOPICAL_OIL | Status: DC | PRN

## 2019-05-25 MED ORDER — MEASLES, MUMPS & RUBELLA VAC IJ SOLR: 0.5000 mL | Freq: Once | INTRAMUSCULAR | Status: DC

## 2019-05-25 MED ORDER — ONDANSETRON HCL 4 MG/2ML IJ SOLN
INTRAMUSCULAR | Status: AC
  Filled 2019-05-25: qty 2

## 2019-05-25 MED ORDER — NALOXONE HCL 0.4 MG/ML IJ SOLN: 0.4000 mg | INTRAMUSCULAR | Status: DC | PRN

## 2019-05-25 MED ORDER — PHENYLEPHRINE HCL (PRESSORS) 10 MG/ML IV SOLN
INTRAVENOUS | Status: DC | PRN
Start: 2019-05-25 — End: 2019-05-25
  Administered 2019-05-25: 40 ug via INTRAVENOUS

## 2019-05-25 MED ORDER — SOD CITRATE-CITRIC ACID 500-334 MG/5ML PO SOLN
30.0000 mL | ORAL | Status: AC
  Administered 2019-05-25: 30 mL via ORAL

## 2019-05-25 MED ORDER — MEPERIDINE HCL 25 MG/ML IJ SOLN: 6.2500 mg | INTRAMUSCULAR | Status: DC | PRN

## 2019-05-25 MED ORDER — CEFAZOLIN SODIUM-DEXTROSE 2-4 GM/100ML-% IV SOLN
2.0000 g | INTRAVENOUS | Status: AC
  Administered 2019-05-25: 10:00:00 2 g via INTRAVENOUS

## 2019-05-25 MED ORDER — SOD CITRATE-CITRIC ACID 500-334 MG/5ML PO SOLN
ORAL | Status: AC
  Filled 2019-05-25: qty 15

## 2019-05-25 MED ORDER — LACTATED RINGERS IV SOLN: INTRAVENOUS | Status: DC | PRN

## 2019-05-25 MED ORDER — MAGNESIUM HYDROXIDE 400 MG/5ML PO SUSP: 30.0000 mL | ORAL | Status: DC | PRN

## 2019-05-25 MED ORDER — OXYCODONE HCL 5 MG/5ML PO SOLN: 5.0000 mg | Freq: Once | ORAL | Status: DC | PRN

## 2019-05-25 MED ORDER — OXYCODONE-ACETAMINOPHEN 5-325 MG PO TABS: 2.0000 | ORAL_TABLET | ORAL | Status: DC | PRN

## 2019-05-25 MED ORDER — NALBUPHINE HCL 10 MG/ML IJ SOLN: 5.0000 mg | Freq: Once | INTRAMUSCULAR | Status: DC | PRN

## 2019-05-25 MED ORDER — BUPIVACAINE IN DEXTROSE 0.75-8.25 % IT SOLN
INTRATHECAL | Status: DC | PRN
  Administered 2019-05-25: 1.3 mL via INTRATHECAL

## 2019-05-25 MED ORDER — GABAPENTIN 300 MG PO CAPS
ORAL_CAPSULE | ORAL | Status: AC
  Filled 2019-05-25: qty 1

## 2019-05-25 MED ORDER — FENTANYL CITRATE (PF) 250 MCG/5ML IJ SOLN
INTRAMUSCULAR | Status: AC
  Filled 2019-05-25: qty 5

## 2019-05-25 MED ORDER — IBUPROFEN 800 MG PO TABS
800.0000 mg | ORAL_TABLET | Freq: Four times a day (QID) | ORAL | Status: DC
  Administered 2019-05-26 – 2019-05-28 (×8): 800 mg via ORAL
  Filled 2019-05-25 (×8): qty 1

## 2019-05-25 MED ORDER — SIMETHICONE 80 MG PO CHEW
80.0000 mg | CHEWABLE_TABLET | ORAL | Status: DC
  Administered 2019-05-26 – 2019-05-27 (×3): 80 mg via ORAL
  Filled 2019-05-25 (×3): qty 1

## 2019-05-25 MED ORDER — KETOROLAC TROMETHAMINE 30 MG/ML IJ SOLN
30.0000 mg | Freq: Four times a day (QID) | INTRAMUSCULAR | Status: AC
  Administered 2019-05-25 – 2019-05-26 (×3): 30 mg via INTRAVENOUS
  Filled 2019-05-25 (×3): qty 1

## 2019-05-25 MED ORDER — LACTATED RINGERS IV SOLN: INTRAVENOUS | Status: DC

## 2019-05-25 MED ORDER — SODIUM CHLORIDE 0.9% FLUSH: 3.0000 mL | INTRAVENOUS | Status: DC | PRN

## 2019-05-25 MED ORDER — CEFAZOLIN SODIUM-DEXTROSE 2-4 GM/100ML-% IV SOLN
INTRAVENOUS | Status: AC
  Filled 2019-05-25: qty 100

## 2019-05-25 MED ORDER — PHENYLEPHRINE HCL-NACL 20-0.9 MG/250ML-% IV SOLN
INTRAVENOUS | Status: AC
  Filled 2019-05-25: qty 250

## 2019-05-25 MED ORDER — ACETAMINOPHEN 500 MG PO TABS
ORAL_TABLET | ORAL | Status: AC
  Filled 2019-05-25: qty 2

## 2019-05-25 MED ORDER — ACETAMINOPHEN 500 MG PO TABS
1000.0000 mg | ORAL_TABLET | Freq: Four times a day (QID) | ORAL | Status: DC
  Administered 2019-05-25 – 2019-05-28 (×10): 1000 mg via ORAL
  Filled 2019-05-25 (×10): qty 2

## 2019-05-25 MED ORDER — OXYCODONE HCL 5 MG PO TABS: 5.0000 mg | ORAL_TABLET | Freq: Once | ORAL | Status: DC | PRN

## 2019-05-25 MED ORDER — KETOROLAC TROMETHAMINE 30 MG/ML IJ SOLN: 30.0000 mg | Freq: Four times a day (QID) | INTRAMUSCULAR | Status: AC | PRN

## 2019-05-25 MED ORDER — FENTANYL CITRATE (PF) 100 MCG/2ML IJ SOLN
INTRAMUSCULAR | Status: DC | PRN
  Administered 2019-05-25: 15 ug via INTRATHECAL

## 2019-05-25 MED ORDER — NALBUPHINE HCL 10 MG/ML IJ SOLN: 5.0000 mg | INTRAMUSCULAR | Status: DC | PRN

## 2019-05-25 MED ORDER — MORPHINE SULFATE (PF) 0.5 MG/ML IJ SOLN
INTRAMUSCULAR | Status: AC
  Filled 2019-05-25: qty 10

## 2019-05-25 MED ORDER — OXYCODONE HCL 5 MG PO TABS
5.0000 mg | ORAL_TABLET | ORAL | Status: DC | PRN
  Administered 2019-05-25 – 2019-05-27 (×7): 10 mg via ORAL
  Filled 2019-05-25 (×4): qty 2
  Filled 2019-05-25 (×2): qty 1
  Filled 2019-05-25 (×2): qty 2

## 2019-05-25 MED ORDER — ACETAMINOPHEN 500 MG PO TABS
1000.0000 mg | ORAL_TABLET | Freq: Once | ORAL | Status: AC
  Administered 2019-05-25: 1000 mg via ORAL

## 2019-05-25 MED ORDER — GABAPENTIN 300 MG PO CAPS
300.0000 mg | ORAL_CAPSULE | Freq: Once | ORAL | Status: AC
  Administered 2019-05-25: 300 mg via ORAL

## 2019-05-25 MED ORDER — FENTANYL CITRATE (PF) 100 MCG/2ML IJ SOLN
INTRAMUSCULAR | Status: AC
  Filled 2019-05-25: qty 2

## 2019-05-25 MED ORDER — HYDROMORPHONE HCL 1 MG/ML IJ SOLN
0.5000 mg | INTRAMUSCULAR | Status: DC | PRN
  Administered 2019-05-25: 0.5 mg via INTRAVENOUS
  Filled 2019-05-25 (×2): qty 1

## 2019-05-25 MED ORDER — KETOROLAC TROMETHAMINE 30 MG/ML IJ SOLN
30.0000 mg | Freq: Once | INTRAMUSCULAR | Status: AC | PRN
  Administered 2019-05-25: 30 mg via INTRAVENOUS

## 2019-05-25 MED ORDER — ONDANSETRON HCL 4 MG/2ML IJ SOLN: 4.0000 mg | Freq: Three times a day (TID) | INTRAMUSCULAR | Status: DC | PRN

## 2019-05-25 MED ORDER — FENTANYL CITRATE (PF) 100 MCG/2ML IJ SOLN
INTRAMUSCULAR | Status: DC | PRN
  Administered 2019-05-25: 100 ug via INTRAVENOUS
  Administered 2019-05-25: 50 ug via INTRAVENOUS
  Administered 2019-05-25: 85 ug via INTRAVENOUS

## 2019-05-25 MED ORDER — SENNOSIDES-DOCUSATE SODIUM 8.6-50 MG PO TABS
2.0000 | ORAL_TABLET | ORAL | Status: DC
  Administered 2019-05-26 – 2019-05-27 (×3): 2 via ORAL
  Filled 2019-05-25 (×3): qty 2

## 2019-05-25 MED ORDER — ENOXAPARIN SODIUM 60 MG/0.6ML ~~LOC~~ SOLN
50.0000 mg | SUBCUTANEOUS | Status: DC
  Administered 2019-05-26 – 2019-05-27 (×2): 50 mg via SUBCUTANEOUS
  Filled 2019-05-25 (×2): qty 0.6

## 2019-05-25 MED ORDER — CYCLOBENZAPRINE HCL 10 MG PO TABS: 10.0000 mg | ORAL_TABLET | Freq: Two times a day (BID) | ORAL | Status: DC | PRN

## 2019-05-25 MED ORDER — DIPHENHYDRAMINE HCL 25 MG PO CAPS
25.0000 mg | ORAL_CAPSULE | Freq: Four times a day (QID) | ORAL | Status: DC | PRN
  Administered 2019-05-25 – 2019-05-26 (×2): 25 mg via ORAL
  Filled 2019-05-25: qty 1

## 2019-05-25 MED ORDER — OXYTOCIN 40 UNITS IN NORMAL SALINE INFUSION - SIMPLE MED: 2.5000 [IU]/h | INTRAVENOUS | Status: AC

## 2019-05-25 MED ORDER — GABAPENTIN 100 MG PO CAPS
300.0000 mg | ORAL_CAPSULE | Freq: Two times a day (BID) | ORAL | Status: DC
  Administered 2019-05-25 – 2019-05-28 (×6): 300 mg via ORAL
  Filled 2019-05-25 (×6): qty 3

## 2019-05-25 MED ORDER — DEXAMETHASONE SODIUM PHOSPHATE 4 MG/ML IJ SOLN
INTRAMUSCULAR | Status: DC | PRN
  Administered 2019-05-25: 4 mg via INTRAVENOUS

## 2019-05-25 MED ORDER — PHENYLEPHRINE HCL-NACL 20-0.9 MG/250ML-% IV SOLN
INTRAVENOUS | Status: DC | PRN
  Administered 2019-05-25: 60 ug/min via INTRAVENOUS

## 2019-05-25 MED ORDER — FENTANYL CITRATE (PF) 100 MCG/2ML IJ SOLN: INTRAMUSCULAR | Status: DC | PRN

## 2019-05-25 MED ORDER — DEXAMETHASONE SODIUM PHOSPHATE 4 MG/ML IJ SOLN
INTRAMUSCULAR | Status: AC
  Filled 2019-05-25: qty 1

## 2019-05-25 MED ORDER — ACETAMINOPHEN 500 MG PO TABS: 1000.0000 mg | ORAL_TABLET | Freq: Four times a day (QID) | ORAL | Status: DC

## 2019-05-25 MED ORDER — MENTHOL 3 MG MT LOZG: 1.0000 | LOZENGE | OROMUCOSAL | Status: DC | PRN

## 2019-05-25 MED ORDER — SCOPOLAMINE 1 MG/3DAYS TD PT72: 1.0000 | MEDICATED_PATCH | Freq: Once | TRANSDERMAL | Status: DC

## 2019-05-25 MED ORDER — HYDROMORPHONE HCL 1 MG/ML IJ SOLN: 0.2500 mg | INTRAMUSCULAR | Status: DC | PRN

## 2019-05-25 MED ORDER — OXYTOCIN 40 UNITS IN NORMAL SALINE INFUSION - SIMPLE MED
INTRAVENOUS | Status: DC | PRN
  Administered 2019-05-25: 200 mL via INTRAVENOUS
  Administered 2019-05-25: 100 mL via INTRAVENOUS

## 2019-05-25 MED ORDER — SIMETHICONE 80 MG PO CHEW: 80.0000 mg | CHEWABLE_TABLET | ORAL | Status: DC | PRN

## 2019-05-25 MED ORDER — PRENATAL MULTIVITAMIN CH
1.0000 | ORAL_TABLET | Freq: Every day | ORAL | Status: DC
  Administered 2019-05-26 – 2019-05-27 (×2): 1 via ORAL
  Filled 2019-05-25 (×2): qty 1

## 2019-05-25 MED ORDER — DIPHENHYDRAMINE HCL 50 MG/ML IJ SOLN: 12.5000 mg | INTRAMUSCULAR | Status: DC | PRN

## 2019-05-25 MED ORDER — FERROUS SULFATE 325 (65 FE) MG PO TABS
325.0000 mg | ORAL_TABLET | Freq: Two times a day (BID) | ORAL | Status: DC
  Administered 2019-05-26 (×2): 325 mg via ORAL
  Filled 2019-05-25 (×2): qty 1

## 2019-05-25 MED ORDER — DIPHENHYDRAMINE HCL 25 MG PO CAPS
25.0000 mg | ORAL_CAPSULE | ORAL | Status: DC | PRN
  Filled 2019-05-25: qty 1

## 2019-05-25 MED ORDER — PROMETHAZINE HCL 25 MG/ML IJ SOLN: 6.2500 mg | INTRAMUSCULAR | Status: DC | PRN

## 2019-05-25 MED ORDER — MORPHINE SULFATE (PF) 0.5 MG/ML IJ SOLN
INTRAMUSCULAR | Status: DC | PRN
  Administered 2019-05-25: .15 mg via INTRATHECAL

## 2019-05-25 MED ORDER — ZOLPIDEM TARTRATE 5 MG PO TABS: 5.0000 mg | ORAL_TABLET | Freq: Every evening | ORAL | Status: DC | PRN

## 2019-05-25 MED ORDER — KETOROLAC TROMETHAMINE 30 MG/ML IJ SOLN
INTRAMUSCULAR | Status: AC
  Filled 2019-05-25: qty 1

## 2019-05-25 MED ORDER — OXYTOCIN 40 UNITS IN NORMAL SALINE INFUSION - SIMPLE MED
INTRAVENOUS | Status: AC
  Filled 2019-05-25: qty 1000

## 2019-05-25 MED ORDER — ONDANSETRON HCL 4 MG/2ML IJ SOLN
INTRAMUSCULAR | Status: DC | PRN
  Administered 2019-05-25: 4 mg via INTRAVENOUS

## 2019-05-25 MED ORDER — TETANUS-DIPHTH-ACELL PERTUSSIS 5-2.5-18.5 LF-MCG/0.5 IM SUSP: 0.5000 mL | Freq: Once | INTRAMUSCULAR | Status: DC

## 2019-05-25 SURGICAL SUPPLY — 34 items
BENZOIN TINCTURE PRP APPL 2/3 (GAUZE/BANDAGES/DRESSINGS) ×2 IMPLANT
CHLORAPREP W/TINT 26ML (MISCELLANEOUS) ×3 IMPLANT
CLAMP CORD UMBIL (MISCELLANEOUS) IMPLANT
CLOTH BEACON ORANGE TIMEOUT ST (SAFETY) ×3 IMPLANT
DRSG OPSITE POSTOP 4X10 (GAUZE/BANDAGES/DRESSINGS) ×3 IMPLANT
ELECT REM PT RETURN 9FT ADLT (ELECTROSURGICAL) ×3
ELECTRODE REM PT RTRN 9FT ADLT (ELECTROSURGICAL) ×1 IMPLANT
EXTRACTOR VACUUM M CUP 4 TUBE (SUCTIONS) IMPLANT
EXTRACTOR VACUUM M CUP 4' TUBE (SUCTIONS)
GLOVE BIOGEL PI IND STRL 7.0 (GLOVE) ×3 IMPLANT
GLOVE BIOGEL PI INDICATOR 7.0 (GLOVE) ×6
GLOVE ECLIPSE 7.0 STRL STRAW (GLOVE) ×3 IMPLANT
GOWN STRL REUS W/TWL LRG LVL3 (GOWN DISPOSABLE) ×6 IMPLANT
KIT ABG SYR 3ML LUER SLIP (SYRINGE) IMPLANT
NDL HYPO 25X5/8 SAFETYGLIDE (NEEDLE) ×1 IMPLANT
NEEDLE HYPO 22GX1.5 SAFETY (NEEDLE) ×3 IMPLANT
NEEDLE HYPO 25X5/8 SAFETYGLIDE (NEEDLE) ×3 IMPLANT
NS IRRIG 1000ML POUR BTL (IV SOLUTION) ×3 IMPLANT
PACK C SECTION WH (CUSTOM PROCEDURE TRAY) ×3 IMPLANT
PAD ABD 7.5X8 STRL (GAUZE/BANDAGES/DRESSINGS) ×3 IMPLANT
PAD OB MATERNITY 4.3X12.25 (PERSONAL CARE ITEMS) ×3 IMPLANT
PENCIL SMOKE EVAC W/HOLSTER (ELECTROSURGICAL) ×3 IMPLANT
RTRCTR C-SECT PINK 25CM LRG (MISCELLANEOUS) IMPLANT
SUT PDS AB 0 CTX 36 PDP370T (SUTURE) IMPLANT
SUT PLAIN 2 0 XLH (SUTURE) IMPLANT
SUT VIC AB 0 CTX 36 (SUTURE) ×4
SUT VIC AB 0 CTX36XBRD ANBCTRL (SUTURE) ×2 IMPLANT
SUT VIC AB 2-0 CT1 27 (SUTURE) ×2
SUT VIC AB 2-0 CT1 TAPERPNT 27 (SUTURE) IMPLANT
SUT VIC AB 4-0 KS 27 (SUTURE) ×3 IMPLANT
SYR CONTROL 10ML LL (SYRINGE) ×3 IMPLANT
TOWEL OR 17X24 6PK STRL BLUE (TOWEL DISPOSABLE) ×3 IMPLANT
TRAY FOLEY W/BAG SLVR 14FR LF (SET/KITS/TRAYS/PACK) ×3 IMPLANT
WATER STERILE IRR 1000ML POUR (IV SOLUTION) ×3 IMPLANT

## 2019-05-25 NOTE — Discharge Instructions (Signed)

## 2019-05-25 NOTE — Discharge Summary (Signed)
Postpartum Discharge Summary     Patient Name: Desiree Pearson DOB: 1985-08-17 MRN: [id number]  Date of admission: 05/25/2019 Delivering Provider: Verita Schneiders A   Date of discharge: 05/28/2019  Admitting diagnosis: History of cesarean section [Z98.891] Intrauterine pregnancy: [redacted]w[redacted]d    Secondary diagnosis:  Active Problems:   History of postpartum depression, currently pregnant   Supervision of high risk pregnancy, antepartum   Wolff-Parkinson-White (WPW) syndrome   Postural orthostatic tachycardia syndrome   Unwanted fertility   Previous cesarean delivery affecting pregnancy, antepartum   Encounter for female sterilization procedure  Additional problems: None     Discharge diagnosis: Term Pregnancy Delivered                                                                                                Post partum procedures: Bilateral partial salpingectomy done at time of Cesarean section  Augmentation: None  Complications: None  Hospital course:  Sceduled C/S   34y.o. yo G2P1002 at 336w0das admitted to the hospital 05/25/2019 for scheduled cesarean section with the following indication:Elective Repeat.  Membrane Rupture Time/Date: 10:08 AM ,05/25/2019   Patient delivered a Viable infant.05/25/2019  Details of operation can be found in separate operative note.  Pateint had an uncomplicated postpartum course.  She is ambulating, tolerating a regular diet, passing flatus, and urinating well. Patient is discharged home in stable condition on  05/28/19        Delivery time: 10:10 AM    Magnesium Sulfate received: No BMZ received: No Rhophylac:N/A MMR:N/A Transfusion:No  Physical exam  Vitals:   05/27/19 0512 05/27/19 1459 05/27/19 1957 05/27/19 2158  BP: 130/83 125/79 124/82 134/80  Pulse: 98 95 88 70  Resp: _0 Temp: 97.9 F (36.6 C) 97.8 F (36.6 C) 98 F (36.7 C) 98.7 F (37.1 C)  TempSrc: Oral Oral Oral Oral  SpO2: 100%  100% 100%  Weight:       Height:       General: alert, cooperative and no distress Lochia: appropriate Uterine Fundus: firm Incision: Healing well with no significant drainage, No significant erythema, Dressing is clean, dry, and intact DVT Evaluation: No evidence of DVT seen on physical exam. Labs: Lab Results  Component Value Date   WBC 11.3 (H) 05/26/2019   HGB 9.5 (L) 05/26/2019   HCT 31.2 (L) 05/26/2019   MCV 78.2 (L) 05/26/2019   PLT 101 (L) 05/26/2019   CMP Latest Ref Rng & Units 05/26/2019  Glucose 70 - 99 mg/dL -  BUN 6 - 20 mg/dL -  Creatinine 0.44 - 1.00 mg/dL 0.45  Sodium 135 - 145 mmol/L -  Potassium 3.5 - 5.1 mmol/L -  Chloride 98 - 111 mmol/L -  CO2 22 - 32 mmol/L -  Calcium 8.9 - 10.3 mg/dL -  Total Protein 6.5 - 8.1 g/dL -  Total Bilirubin 0.3 - 1.2 mg/dL -  Alkaline Phos 38 - 126 U/L -  AST 15 - 41 U/L -  ALT 0 - 44 U/L -   Edinburgh Score: Edinburgh Postnatal Depression Scale Screening Tool 05/27/2019  I have been able to laugh and see the funny side of things. (No Data)    Discharge instruction: per After Visit Summary and "Baby and Me Booklet".  After visit meds:  Allergies as of 05/28/2019      Reactions   Morphine Rash, Other (See Comments)   Heart rate dropped   Sulfur Rash      Medication List    STOP taking these medications   Misc. Devices Misc     TAKE these medications   acetaminophen 500 MG tablet Commonly known as: TYLENOL Take 1 tablet (500 mg total) by mouth every 6 (six) hours as needed for mild pain.   Blood Pressure Monitoring Devi 1 Device by Does not apply route once a week.   cyclobenzaprine 10 MG tablet Commonly known as: FLEXERIL Take 1 tablet (10 mg total) by mouth 2 (two) times daily as needed for muscle spasms.   ferrous sulfate 325 (65 FE) MG tablet Take 1 tablet (325 mg total) by mouth daily with breakfast.   folic acid 1 MG tablet Commonly known as: FOLVITE Take 1 mg by mouth daily.   ibuprofen 800 MG tablet Commonly known  as: ADVIL Take 1 tablet (800 mg total) by mouth every 6 (six) hours.   oxyCODONE 5 MG immediate release tablet Commonly known as: Oxy IR/ROXICODONE Take 1-2 tablets (5-10 mg total) by mouth every 4 (four) hours as needed for moderate pain.   polyethylene glycol 17 g packet Commonly known as: MIRALAX / GLYCOLAX Take 17 g by mouth daily.   PRENATAL VITAMINS PO Take 1 tablet by mouth daily.   senna-docusate 8.6-50 MG tablet Commonly known as: Senokot-S Take 2 tablets by mouth daily. Start taking on: May 29, 2019       Diet: routine diet  Activity: Advance as tolerated. Pelvic rest for 6 weeks.   Outpatient follow up: 4-6 weeks Follow up Appt: Future Appointments  Date Time Provider [place]  06/08/2019 10:00 AM [place] None  06/22/2019  9:30 AM Chancy Milroy, MD [facility] None   Follow up Visit:    Please schedule this patient for Postpartum visit in: 4-6 weeks with the following provider: Any provider In-Person or Virtual, per patient preference For C/S patients schedule nurse incision check in weeks 2 weeks: yes Low risk pregnancy complicated by: h/o WPW (s/p ablation) and POTS Delivery mode:  CS Anticipated Birth Control:  BTL done PP PP Procedures needed: Incision check  Schedule Integrated BH visit: no     Newborn Data: Live born female  Birth Weight:  3181g APGAR: 1, 9  Newborn Delivery   Birth date/time: 05/25/2019 10:10:00 Delivery type: C-Section, Vacuum Assisted Trial of labor: No C-section categorization: Repeat      Baby Feeding: Breast Disposition:home with mother   05/28/2019 Chauncey Mann, MD

## 2019-05-25 NOTE — Lactation Note (Signed)
This note was copied from a baby's chart. Lactation Consultation Note  Patient Name: Desiree Pearson [id number] Date: 05/25/2019 Reason for consult: Initial assessment;Term  P2 mother whose infant is now 31 hours old.  This is a term baby at 39+0 weeks.  Mother breast fed her first child (now 32 years old) for 2 weeks.  Baby was swaddled and asleep when I arrived.  Mother informed me that this baby has breast fed much better than her first child.  Encouraged to feed 8-12 times/24 hours or sooner if baby shows feeding cues.  Reviewed cues with mother.  Suggested she practice hand expression before/after feedings to help increase milk supply.  Colostrum container provided and milk storage times discussed.  Finger feeding demonstrated.  Encouraged mother to ask her RN/LC for latch assistance as needed.    Mom made aware of O/P services, breastfeeding support groups, community resources, and our phone # for post-discharge questions.  Mother does not have a DEBP for home use.  She is uncertain as to her plans for breast feeding/pumping after discharge.  Informed her of the [facility] program as it relates to obtaining a DEBP.  Mother has an appointment scheduled for Jun 05, 2019 with the [facility] representative.  She will discuss her options at that time.  Mother aware that she is not eligible for a DEBP if she chooses the formula package.  Support person present and sleeping.   Maternal Data Formula Feeding for Exclusion: Yes Reason for exclusion: Mother's choice to formula and breast feed on admission Has patient been taught Hand Expression?: Yes Does the patient have breastfeeding experience prior to this delivery?: Yes  Feeding Feeding Type: Breast Fed  LATCH Score Latch: Grasps breast easily, tongue down, lips flanged, rhythmical sucking.  Audible Swallowing: Spontaneous and intermittent  Type of Nipple: Everted at rest and after stimulation  Comfort (Breast/Nipple): Soft / non-tender  Hold  (Positioning): Assistance needed to correctly position infant at breast and maintain latch.  LATCH Score: 9  Interventions    Lactation Tools Discussed/Used WIC Program: Yes   Consult Status Consult Status: Follow-up Date: 05/26/19 Follow-up type: In-patient    Little Ishikawa 05/25/2019, 4:41 PM

## 2019-05-25 NOTE — H&P (Addendum)
Obstetric Preoperative History and Physical  Desiree Pearson is a 34 y.o. G2P1 with IUP at [redacted]w[redacted]d presenting for scheduled repeat cesarean section and bilateral tubal sterilization.  Reports good fetal movement, no bleeding, no contractions, no leaking of fluid.  No acute preoperative concerns.    Cesarean Section Indication: Previous cesarean section  Prenatal Course Source of Care: [facility] Pregnancy complications or risks: Patient Active Problem List   Diagnosis Date Noted  . History of cesarean section 05/25/2019  . Previous cesarean delivery affecting pregnancy, antepartum 04/19/2019  . Unwanted fertility 04/05/2019  . History of congenital heart defect 04/05/2019  . Thalassemia alpha carrier 01/04/2019  . History of postpartum depression, currently pregnant 10/30/2018  . Supervision of high risk pregnancy, antepartum 10/30/2018  . Wolff-Parkinson-White (WPW) syndrome   . Postural orthostatic tachycardia syndrome   . Depressive disorder, not elsewhere classified 03/12/2009    Nursing Staff Provider  Office Location  [facility] Dating  LMP c/w 18 wk sono  Language   English Anatomy US  WNL  Flu Vaccine  Declined-11/23/2018 Genetic Screen  NIPS:  Low risk female  TDaP vaccine   Declined 03/08/19 Hgb A1C or  GTT Early: wnl @ 20 w Third trimester normal  Rhogam     LAB RESULTS   Feeding Plan both Blood Type AB/Positive/-- (10/22 1106)   Contraception tubal Antibody Negative (10/22 1106)  Circumcision yes Rubella 11.40 (10/22 1106)  Pediatrician  Dr. Liana Crocker [facility] RPR Non Reactive (02/04 1056)   Support Person John  Pearce(FOB) HBsAg Negative (10/22 1106)   Prenatal Classes  offered HIV Non Reactive (02/04 1056)  BTL Consent 28 WEEKS GBS Negative  VBAC Consent N/A Pap Normal 11/2018    Hgb Electro  Alphal thal carrier  BP Cuff Yes  CF Neg Horizon    SMA Neg [place]     Past Medical History:  Diagnosis Date  . Chlamydia infection affecting pregnancy 11/29/2018   . Postural orthostatic tachycardia syndrome   . Wolff-Parkinson-White (WPW) syndrome    s/p ablation in 2006    Past Surgical History:  Procedure Laterality Date  . ABLATION  2006   for Wolf parkinson white disease  . CESAREAN SECTION      OB History  Gravida Para Term Preterm AB Living  2 1       1   SAB TAB Ectopic Multiple Live Births          1    # Outcome Date GA Lbr Len/2nd Weight Sex Delivery Anes PTL Lv  2 Current           1 Para 03/14/14 [redacted]w[redacted]d  3033 g F CS-Unspec EPI  LIV     Birth Comments: emergency c/s for FHR decels, nucal cord, FTD; IOL due to cardiac issues     Complications: Failure to Progress in Second Stage    Social History   Socioeconomic History  . Marital status: Legally Separated    Spouse name: Not on file  . Number of children: 1  . Years of education: Not on file  . Highest education level: Not on file  Occupational History  . Not on file  Tobacco Use  . Smoking status: Never Smoker  . Smokeless tobacco: Never Used  Substance and Sexual Activity  . Alcohol use: Not Currently    Comment: occasionally wine  . Drug use: Never  . Sexual activity: Yes    Birth control/protection: None  Other Topics Concern  . Not on file  Social  History Narrative  . Not on file   Social Determinants of Health   Financial Resource Strain:   . Difficulty of Paying Living Expenses:   Food Insecurity: No Food Insecurity  . Worried About Charity fundraiser in the Last Year: Never true  . Ran Out of Food in the Last Year: Never true  Transportation Needs: No Transportation Needs  . Lack of Transportation (Medical): No  . Lack of Transportation (Non-Medical): No  Physical Activity:   . Days of Exercise per Week:   . Minutes of Exercise per Session:   Stress:   . Feeling of Stress :   Social Connections:   . Frequency of Communication with Friends and Family:   . Frequency of Social Gatherings with Friends and Family:   . Attends Religious Services:    . Active Member of Clubs or Organizations:   . Attends Archivist Meetings:   Marland Kitchen Marital Status:     Family History  Problem Relation Age of Onset  . Diabetes Father   . Hypertension Father     Medications Prior to Admission  Medication Sig Dispense Refill Last Dose  . Blood Pressure Monitoring DEVI 1 Device by Does not apply route once a week. (Patient not taking: Reported on 05/03/2019) 1 Device 0   . cyclobenzaprine (FLEXERIL) 10 MG tablet Take 1 tablet (10 mg total) by mouth 2 (two) times daily as needed for muscle spasms. 10 tablet 0   . folic acid (FOLVITE) 1 MG tablet Take 1 mg by mouth daily.     . Misc. Devices MISC Dispense one maternity belt for patient (Patient not taking: Reported on 04/19/2019) 1 each 0   . Prenatal Vit-Fe Fumarate-FA (PRENATAL VITAMINS PO) Take 1 tablet by mouth daily.       Allergies  Allergen Reactions  . Morphine Rash and Other (See Comments)    Heart rate dropped  . Sulfur Rash    Review of Systems: Pertinent items noted in HPI and remainder of comprehensive ROS otherwise negative.  Physical Exam: BP 118/83 (BP Location: Right Arm)   Pulse (!) 103   Resp 17   Ht 5\' 2"  (1.575 m)   Wt 93.4 kg   LMP 08/25/2018 (Within Days)   SpO2 99%   BMI 37.68 kg/m  FHR by Doppler: 136 bpm CONSTITUTIONAL: Well-developed, well-nourished female in no acute distress.  HENT:  Normocephalic, atraumatic, External right and left ear normal. Oropharynx is clear and moist EYES: Conjunctivae and EOM are normal. Pupils are equal, round, and reactive to light. No scleral icterus.  NECK: Normal range of motion, supple, no masses SKIN: Skin is warm and dry. No rash noted. Not diaphoretic. No erythema. No pallor. [place]: Alert and oriented to person, place, and time. Normal reflexes, muscle tone coordination. No cranial nerve deficit noted. PSYCHIATRIC: Normal mood and affect. Normal behavior. Normal judgment and thought content. CARDIOVASCULAR: Normal  heart rate noted, regular rhythm RESPIRATORY: Effort and breath sounds normal, no problems with respiration noted ABDOMEN: Soft, nontender, nondistended, gravid. Well-healed Pfannenstiel incision. PELVIC: Deferred MUSCULOSKELETAL: Normal range of motion. No edema and no tenderness. 2+ distal pulses.   Pertinent Labs/Studies:   Results for orders placed or performed during the hospital encounter of 05/23/19 (from the past 72 hour(s))  SARS CORONAVIRUS 2 (TAT 6-24 HRS) Nasopharyngeal Nasopharyngeal Swab     Status: None   Collection Time: 05/23/19  8:18 AM   Specimen: Nasopharyngeal Swab  Result Value Ref Range   SARS Coronavirus  2 NEGATIVE NEGATIVE    Comment: (NOTE) SARS-CoV-2 target nucleic acids are NOT DETECTED. The SARS-CoV-2 RNA is generally detectable in upper and lower respiratory specimens during the acute phase of infection. Negative results do not preclude SARS-CoV-2 infection, do not rule out co-infections with other pathogens, and should not be used as the sole basis for treatment or other patient management decisions. Negative results must be combined with clinical observations, patient history, and epidemiological information. The expected result is Negative. Fact Sheet for Patients: SugarRoll.be Fact Sheet for Healthcare Providers: https://www.woods-mathews.com/ This test is not yet approved or cleared by the [place] FDA and  has been authorized for detection and/or diagnosis of SARS-CoV-2 by FDA under an Emergency Use Authorization (EUA). This EUA will remain  in effect (meaning this test can be used) for the duration of the COVID-19 declaration under Section 56 4(b)(1) of the Act, 21 U.S.C. section 360bbb-3(b)(1), unless the authorization is terminated or revoked sooner. Performed at [facility], [place] [address], [place], [place] [address]   CBC     Status: Abnormal   Collection Time: 05/23/19  8:28 AM   Result Value Ref Range   WBC 6.4 4.0 - 10.5 K/uL   RBC 5.14 (H) 3.87 - 5.11 MIL/uL   Hemoglobin 11.8 (L) 12.0 - 15.0 g/dL   HCT 39.3 36.0 - 46.0 %   MCV 76.5 (L) 80.0 - 100.0 fL   MCH 23.0 (L) 26.0 - 34.0 pg   MCHC 30.0 30.0 - 36.0 g/dL   RDW 16.0 (H) 11.5 - 15.5 %   Platelets 153 150 - 400 K/uL    Comment: REPEATED TO VERIFY   nRBC 0.0 0.0 - 0.2 %    Comment: Performed at [facility], [place] [address] [place], [place] [address]  Comprehensive metabolic panel     Status: Abnormal   Collection Time: 05/23/19  8:28 AM  Result Value Ref Range   Sodium 130 (L) 135 - 145 mmol/L   Potassium 3.7 3.5 - 5.1 mmol/L   Chloride 103 98 - 111 mmol/L   CO2 17 (L) 22 - 32 mmol/L   Glucose, Bld 119 (H) 70 - 99 mg/dL    Comment: Glucose reference range applies only to samples taken after fasting for at least 8 hours.   BUN <5 (L) 6 - 20 mg/dL   Creatinine, Ser 0.60 0.44 - 1.00 mg/dL   Calcium 8.9 8.9 - 10.3 mg/dL   Total Protein 6.7 6.5 - 8.1 g/dL   Albumin 2.9 (L) 3.5 - 5.0 g/dL   AST 20 15 - 41 U/L   ALT 16 0 - 44 U/L   Alkaline Phosphatase 196 (H) 38 - 126 U/L   Total Bilirubin 0.4 0.3 - 1.2 mg/dL   GFR calc non Af Amer >60 >60 mL/min   GFR calc Af Amer >60 >60 mL/min   Anion gap 10 5 - 15    Comment: Performed at [facility], [place] [address] [place], [place] [address]  RPR     Status: None   Collection Time: 05/23/19  8:28 AM  Result Value Ref Range   RPR Ser Ql NON REACTIVE NON REACTIVE    Comment: Performed at [facility], [place] [address] [place],  [address]  Type and screen [place]     Status: None   Collection Time: 05/23/19  8:32 AM  Result Value Ref Range   ABO/RH(D) AB POS  Antibody Screen NEG    Sample Expiration      05/26/2019,2359 Performed at [facility], [place] [address], [place], [place] [address]   ABO/Rh     Status: None   Collection Time: 05/23/19  8:32 AM  Result Value Ref Range   ABO/RH(D)      AB  POS Performed at [place] [address], [place], [place] [address]     Assessment and Plan: Desiree Pearson is a 34 y.o. G2P1 at [redacted]w[redacted]d being admitted for scheduled repeat cesarean section and bilateral tubal sterilization.  The risks of cesarean section were discussed with the patient including but were not limited to: bleeding which may require transfusion or reoperation; infection which may require antibiotics; injury to bowel, bladder, ureters or other surrounding organs; injury to the fetus; need for additional procedures including hysterectomy in the event of a life-threatening hemorrhage; placental abnormalities wth subsequent pregnancies, incisional problems, thromboembolic phenomenon and other postoperative/anesthesia complications.  Patient also desires permanent sterilization; this will be done as bilateral salpingectomy or tubal occlusion.  Other reversible forms of contraception were discussed with patient; she declines all other modalities. Risks of procedure discussed with patient including but not limited to: risk of regret, permanence of method, bleeding, infection, injury to surrounding organs and need for additional procedures.  Failure risk of about 1% with increased risk of ectopic gestation if pregnancy occurs was also discussed with patient.  Also discussed possibility of post-tubal pain syndrome. The patient concurred with the proposed plan, giving informed written consent for the procedures.  Patient has been NPO since last night she will remain NPO for procedure. Anesthesia and OR aware.  Preoperative prophylactic antibiotics and SCDs ordered on call to the OR.  To OR when ready.     Verita Schneiders, MD, [place] for [employer], [place]

## 2019-05-25 NOTE — Transfer of Care (Signed)
Immediate Anesthesia Transfer of Care Note  Patient: Desiree Pearson  Procedure(s) Performed: CESAREAN SECTION WITH SALPINGECTOMY (N/A )  Patient Location: PACU  Anesthesia Type:Spinal  Level of Consciousness: awake, alert  and oriented  Airway & Oxygen Therapy: Patient Spontanous Breathing  Post-op Assessment: Report given to RN and Post -op Vital signs reviewed and stable  Post vital signs: Reviewed and stable  Last Vitals:  Vitals Value Taken Time  BP 121/73 05/25/19 1540  Temp 36.7 C 05/25/19 1540  Pulse 86 05/25/19 1540  Resp 20 05/25/19 1540  SpO2 100 % 05/25/19 1435    Last Pain:  Vitals:   05/25/19 1848  TempSrc:   PainSc: 0-No pain         Complications: No apparent anesthesia complications

## 2019-05-25 NOTE — Op Note (Signed)
[name] PROCEDURE DATE: 05/25/2019  PREOPERATIVE DIAGNOSES: Intrauterine pregnancy at [redacted]w[redacted]d weeks gestation; previous cesarean section; undesired fertility  POSTOPERATIVE DIAGNOSES: The same  PROCEDURE: Repeat Low Transverse Cesarean Section, Bilateral Tubal Sterilization using Salpingectomy  SURGEON:  Dr. Verita Schneiders  ASSISTANT:  Dr. Cameron Sprang, Pearla Dubonnet MSIII  ANESTHESIOLOGY TEAM:  Anesthesiologist: Pervis Hocking, DO CRNA: Rhymer, Waynard Reeds, CRNA  INDICATIONS: Desiree Pearson is a 34 y.o. G2P1002 at [redacted]w[redacted]d here for repeat cesarean section and bilateral tubal sterilization secondary to the indications listed under preoperative diagnoses; please see preoperative note for further details.  The risks of surgery were discussed with the patient including but were not limited to: bleeding which may require transfusion or reoperation; infection which may require antibiotics; injury to bowel, bladder, ureters or other surrounding organs; injury to the fetus; need for additional procedures including hysterectomy in the event of a life-threatening hemorrhage; placental abnormalities wth subsequent pregnancies, incisional problems, thromboembolic phenomenon and other postoperative/anesthesia complications.  Patient also desires permanent sterilization.  Other reversible forms of contraception were discussed with patient; she declines all other modalities. Risks of procedure discussed with patient including but not limited to: risk of regret, permanence of method, bleeding, infection, injury to surrounding organs and need for additional procedures.  Failure risk of 1% with increased risk of ectopic gestation if pregnancy occurs was also discussed with patient.  Also discussed possibility of post-tubal pain syndrome. The patient concurred with the proposed plan, giving informed written consent for the procedures.    FINDINGS:  Viable female infant in cephalic presentation.  Apgars 7 and  9.  Clear amniotic fluid.  Intact placenta, three vessel cord.  Some adhesive disease involving adherent proximal portions of bilateral fallopian tubes to underlying mesopalpinges bilaterally; mid and distal portion including fimbriated ends were removed allowing for bilateral salpingectomy.   Normal uterus, fallopian tubes and ovaries bilaterally. Two small anterior fibroids noted. Fallopian tubes were sterilized bilaterally.  ANESTHESIA: Spinal ESTIMATED BLOOD LOSS: 400 ml SPECIMENS: Placenta sent to L&D.  Biilateral fallopian tube fragments sent to pathology COMPLICATIONS: None immediate  PROCEDURE IN DETAIL:  The patient preoperatively received intravenous antibiotics and had sequential compression devices applied to her lower extremities.   She was then taken to the operating room where spinal anesthesia was administered and was found to be adequate. She was then placed in a dorsal supine position with a leftward tilt, and prepped and draped in a sterile manner.  A foley catheter was placed into her bladder and attached to constant gravity.  After an adequate timeout was performed, a Pfannenstiel skin incision was made with scalpel over her preexisting scar and carried through to the underlying layer of fascia. The fascia was incised in the midline, and this incision was extended bilaterally using the Mayo scissors.  Kocher clamps were applied to the superior aspect of the fascial incision and the underlying rectus muscles were dissected off bluntly.  A similar process was carried out on the inferior aspect of the fascial incision. The rectus muscles were separated in the midline and the peritoneum was entered bluntly. The Alexis self-retaining retractor was introduced into the abdominal cavity.  Attention was turned to the lower uterine segment where a low transverse hysterotomy was made with a scalpel and extended bilaterally bluntly.  The infant was successfully delivered but had to use vacuum for  delivery of fetal head (was in place for less than five seconds), the cord was clamped and cut after one minute, and the infant was  handed over to the awaiting neonatology team. Uterine massage was then administered, and the placenta delivered intact with a three-vessel cord. The uterus was then cleared of clots and debris.  The hysterotomy was closed with 0 Vicryl in a running locked fashion, and an imbricating layer was also placed with 0 Vicryl.  Figure-of-eight 0 Vicryl serosal stitches were placed to help with hemostasis.  Attention was then turned to the fallopian tubes, and the mid and distal portion of the right tube and mesosalpinx was doubly clamped using Kelly clamps and this portion of the tube removed using Metzenbaum scissors. The remaining pedicle was tied off using a 2-0 Vicryl free tie and a subsequent suture ligation with 2-0 Vicryl. The pedicle was inspected and found to be hemostatic.  A similar process was carried out on the left side allowing for bilateral tubal salpingectomy. Only about 1.5 cm of proximal ends of tubes remained; these ends were tightly adherent to uterus and underlying mesosalpinges. Excellent hemostasis was noted. The pelvis was cleared of all clot and debris. Hemostasis was confirmed on all surfaces.  The retractor was removed.  The peritoneum was closed with a 0 Vicryl interrupted stitches. The fascia was then closed using 0 PDS in a running fashion.  The subcutaneous layer was irrigated, and the skin was closed with a 4-0 Vicryl subcuticular stitch. The patient tolerated the procedure well. Sponge, instrument and needle counts were correct x 3.  She was taken to the recovery room in stable condition.    Verita Schneiders, MD, [place] [facility] [employer], [place]

## 2019-05-26 ENCOUNTER — Encounter: Payer: Self-pay | Admitting: Anesthesiology

## 2019-05-26 LAB — CREATININE, SERUM
Creatinine, Ser: 0.45 mg/dL (ref 0.44–1.00)
GFR calc Af Amer: >60 mL/min (ref >60)
GFR calc non Af Amer: >60 mL/min (ref >60)

## 2019-05-26 LAB — CBC
HCT: 31.2 % — ABNORMAL LOW (ref 36.0–46.0)
Hemoglobin: 9.5 g/dL — ABNORMAL LOW (ref 12.0–15.0)
MCH: 23.8 pg — ABNORMAL LOW (ref 26.0–34.0)
MCHC: 30.4 g/dL (ref 30.0–36.0)
MCV: 78.2 fL — ABNORMAL LOW (ref 80.0–100.0)
Platelets: 101 10*3/uL — ABNORMAL LOW (ref 150–400)
RBC: 3.99 MIL/uL (ref 3.87–5.11)
RDW: 16 % — ABNORMAL HIGH (ref 11.5–15.5)
WBC: 11.3 10*3/uL — ABNORMAL HIGH (ref 4.0–10.5)
nRBC: 0 % (ref 0.0–0.2)

## 2019-05-26 LAB — BIRTH TISSUE RECOVERY COLLECTION (PLACENTA DONATION)

## 2019-05-26 NOTE — Progress Notes (Signed)
CSW received consult for hx of Anxiety and Depression.    CSW met with MOB to offer support and complete assessment. MOB's mother and infant Nia were present on arrival however, mother stepped out after PPD/A and SIDS education to offer MOB privacy during assessment. MOB and her mother were pleasant and engaged during visit.  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 MOB reported sx as isolation and sadness. MOB reported going to therapy while in foster care as child, 14 y/o. MOB reported she was depressed during pregnancy due to strained relationship with FOB. MOB stated she is interested in counseling services.MOB denied SI, HI, or domestic violence. CSW offered encouragement and provided list of counseling services for uninsured, as MOB stated this would be of concern once Medicaid is dropped. MOB also mentioned being worried about having enough formula until May 4th WIC appointment. CSW provided MOB with contact information for [facility] for resources. MOB identified her mom and friend in [place] as her support, and stated her children bring her joy.    CSW provided education regarding the baby blues period vs. perinatal mood disorders, discussed treatment and gave resources for mental health follow up if concerns arise.  CSW recommends self-evaluation during the postpartum time period using the New Mom Checklist from Postpartum Progress and encouraged MOB to contact a medical professional if symptoms are noted at any time. MOB denied any questions.     CSW provided review of Sudden Infant Death Syndrome (SIDS) precautions. MOB confirmed having all needed items excepted above stated concern with formula. MOB confirmed having new car seat and crib for infant's safe sleep.    CSW identifies no further need for intervention and no barriers to discharge at this time.  Dollye Glasser D. Bless Lisenby, MSW, LCSWA Clinical Social Worker [phone] 

## 2019-05-26 NOTE — Progress Notes (Signed)
Subjective: Postpartum Day 1: Cesarean Delivery Patient reports tolerating PO, + flatus and no problems voiding.  Has been "taking it slow" regarding ambulation.   Objective: Vital signs in last 24 hours: Temp:  [97.6 F (36.4 C)-98.2 F (36.8 C)] 98.1 F (36.7 C) (04/24 0504) Pulse Rate:  [81-101] 85 (04/24 0504) Resp:  [13-27] 18 (04/24 0504) BP: (107-132)/(65-86) 107/65 (04/24 0504) SpO2:  [98 %-100 %] 100 % (04/24 0504) Weight:  [93.4 kg] 93.4 kg (04/23 0756)  Physical Exam:  General: alert and no distress Lochia: appropriate Uterine Fundus: firm Incision: no significant drainage, no significant erythema DVT Evaluation: No evidence of DVT seen on physical exam.  Recent Labs    05/23/19 0828 05/26/19 0627  HGB 11.8* 9.5*  HCT 39.3 31.2*    Assessment/Plan: Status post Cesarean section. Doing well postoperatively.  Creatine stable. Mild leukocytosis with WBC 11.3 however patient afebrile. Hemoglobin 9.5 with thrombocytopenia (Plt 101) s/p CS.  Continue current care.  Lyndee Hensen, DO 05/26/2019, 7:55 AM

## 2019-05-26 NOTE — Progress Notes (Signed)
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  States she has not had any falls during that time.  States that when she gets up too quickly she may have symptoms but her body "warns her and she has time to sit down".  Patient denied dizziness when she got up to void.  Patient has been cautioned to call for assistance with mobility, especially if she has any dizziness or feels faint.  Ronney Lion Seairra Otani Therapist, sports

## 2019-05-26 NOTE — Anesthesia Postprocedure Evaluation (Signed)
Anesthesia Post Note  Patient: Desiree Pearson  Procedure(s) Performed: CESAREAN SECTION WITH SALPINGECTOMY (N/A )     Patient location during evaluation: PACU Anesthesia Type: Spinal Level of consciousness: awake and alert and oriented Pain management: pain level controlled Vital Signs Assessment: post-procedure vital signs reviewed and stable Respiratory status: spontaneous breathing, nonlabored ventilation and respiratory function stable Cardiovascular status: blood pressure returned to baseline and stable Postop Assessment: no headache, no backache, spinal receding and patient able to bend at knees Anesthetic complications: no    Last Vitals:  Vitals:   05/26/19 0110 05/26/19 0504  BP: 117/68 107/65  Pulse: 96 85  Resp: 20 18  Temp: 36.8 C 36.7 C  SpO2: 100% 100%    Last Pain:  Vitals:   05/26/19 0504  TempSrc: Oral  PainSc: 0-No pain   Pain Goal:                   Pervis Hocking

## 2019-05-27 MED ORDER — WITCH HAZEL-GLYCERIN EX PADS: 1.0000 "application " | MEDICATED_PAD | CUTANEOUS | Status: DC | PRN

## 2019-05-27 MED ORDER — FERROUS SULFATE 325 (65 FE) MG PO TABS
325.0000 mg | ORAL_TABLET | Freq: Every day | ORAL | Status: DC
  Administered 2019-05-27 – 2019-05-28 (×2): 325 mg via ORAL
  Filled 2019-05-27 (×2): qty 1

## 2019-05-27 MED ORDER — POLYETHYLENE GLYCOL 3350 17 G PO PACK
17.0000 g | PACK | Freq: Every day | ORAL | Status: DC
  Filled 2019-05-27 (×2): qty 1

## 2019-05-27 MED ORDER — DIBUCAINE (PERIANAL) 1 % EX OINT: 1.0000 "application " | TOPICAL_OINTMENT | CUTANEOUS | Status: DC | PRN

## 2019-05-27 NOTE — Progress Notes (Signed)
Subjective: Postpartum Day 1: Cesarean Delivery Patient reports feeling okay. Some pain in her vagina. She has been taking Oxy, Gabapentin, Ibuprofen and Tylenol. Ambulating and voiding without difficulty. Passing flatus.   Objective: Vital signs in last 24 hours: Temp:  [97.6 F (36.4 C)-98.1 F (36.7 C)] 98.1 F (36.7 C) (04/24 2019) Pulse Rate:  [85-100] 100 (04/24 2232) Resp:  [17-18] 17 (04/24 2232) BP: (105-127)/(65-77) 127/77 (04/24 2232) SpO2:  [100 %] 100 % (04/24 2019)  Physical Exam:  General: alert and no distress Lochia: appropriate Uterine Fundus: firm Incision: no significant drainage, no significant erythema DVT Evaluation: No evidence of DVT seen on physical exam.  Recent Labs    05/26/19 0627  HGB 9.5*  HCT 31.2*    Assessment/Plan: Status post Cesarean section. Doing well postoperatively. Patient desires DC on POD#3; should she change her mind, please page team for orders. Continue current care. Cont PO iron and prescribe on discharge Vitals stable Breastfeeding S/p Bilateral salpingectomy  Chauncey Mann, MD 05/27/2019, 3:18 AM

## 2019-05-27 NOTE — Lactation Note (Signed)
This note was copied from a baby's chart. Lactation Consultation Note  Patient Name: Desiree Pearson [id number] Date: 05/27/2019 Reason for consult: Follow-up assessment LC noticed moms last feeds had only been formula. Mom reports she has decided to formula feed.  Mom reports she is not interested in pumping and does not need to be seen by Lactation again.  Will be d/c tomorrow.  Maternal Data    Feeding Feeding Type: Formula Nipple Type: Slow - flow  LATCH Score                   Interventions    Lactation Tools Discussed/Used     Consult Status Consult Status: Complete Date: 05/27/19 Follow-up type: Call as needed    [facility] 05/27/2019, 12:41 PM

## 2019-05-28 LAB — SURGICAL PATHOLOGY

## 2019-05-28 MED ORDER — IBUPROFEN 800 MG PO TABS: 800.0000 mg | ORAL_TABLET | Freq: Four times a day (QID) | ORAL | 0 refills | Status: DC

## 2019-05-28 MED ORDER — FERROUS SULFATE 325 (65 FE) MG PO TABS: 325.0000 mg | ORAL_TABLET | Freq: Every day | ORAL | 0 refills | Status: AC

## 2019-05-28 MED ORDER — ACETAMINOPHEN 500 MG PO TABS: 500.0000 mg | ORAL_TABLET | Freq: Four times a day (QID) | ORAL | 0 refills | Status: AC | PRN

## 2019-05-28 MED ORDER — SENNOSIDES-DOCUSATE SODIUM 8.6-50 MG PO TABS: 2.0000 | ORAL_TABLET | ORAL | 0 refills | Status: AC

## 2019-05-28 MED ORDER — OXYCODONE HCL 5 MG PO TABS: 5.0000 mg | ORAL_TABLET | ORAL | 0 refills | Status: AC | PRN

## 2019-05-28 MED ORDER — POLYETHYLENE GLYCOL 3350 17 G PO PACK: 17.0000 g | PACK | Freq: Every day | ORAL | 0 refills | Status: AC

## 2019-05-28 NOTE — Progress Notes (Signed)
This RN received permission from V. Corine Shelter, CNM to allow pt to drive herself home from the hospital d/t lack of other transportation. Pt agrees to only drive home (which is 65min away from hospital) and to not take narcotics while driving.

## 2019-05-30 ENCOUNTER — Other Ambulatory Visit (HOSPITAL_COMMUNITY): Admission: RE | Admit: 2019-05-30 | Payer: Medicaid Other | Source: Ambulatory Visit

## 2019-05-30 ENCOUNTER — Inpatient Hospital Stay (HOSPITAL_COMMUNITY)
Admission: AD | Admit: 2019-05-30 | Payer: Medicaid Other | Source: Home / Self Care | Admitting: Obstetrics & Gynecology

## 2019-06-08 ENCOUNTER — Inpatient Hospital Stay
Admission: RE | Admit: 2019-06-08 | Discharge: 2019-06-08 | Disposition: A | Discharge status: Home / Self Care | Payer: Medicaid Other | Source: Ambulatory Visit

## 2019-06-08 ENCOUNTER — Ambulatory Visit (INDEPENDENT_AMBULATORY_CARE_PROVIDER_SITE_OTHER)
Admission: RE | Admit: 2019-06-08 | Discharge: 2019-06-08 | Disposition: A | Discharge status: Other Acute Inpatient Hospital | Payer: Medicaid Other | Source: Ambulatory Visit

## 2019-06-08 ENCOUNTER — Other Ambulatory Visit: Payer: Self-pay

## 2019-06-08 ENCOUNTER — Encounter (HOSPITAL_COMMUNITY): Payer: Self-pay

## 2019-06-08 ENCOUNTER — Ambulatory Visit (HOSPITAL_COMMUNITY)
Admission: EM | Admit: 2019-06-08 | Discharge: 2019-06-08 | Disposition: A | Discharge status: Home / Self Care | Payer: Medicaid Other | Attending: Emergency Medicine | Admitting: Emergency Medicine

## 2019-06-08 ENCOUNTER — Ambulatory Visit: Payer: Medicaid Other

## 2019-06-08 DIAGNOSIS — H5713 Ocular pain, bilateral: Secondary | ICD-10-CM

## 2019-06-08 DIAGNOSIS — S0502XA Injury of conjunctiva and corneal abrasion without foreign body, left eye, initial encounter: Secondary | ICD-10-CM | POA: Diagnosis not present

## 2019-06-08 DIAGNOSIS — H5789 Other specified disorders of eye and adnexa: Secondary | ICD-10-CM

## 2019-06-08 MED ORDER — SYSTANE 0.4-0.3 % OP SOLN
1.0000 [drp] | Freq: Four times a day (QID) | OPHTHALMIC | 0 refills | Status: DC | PRN
Start: 2019-06-08 — End: 2019-06-08

## 2019-06-08 MED ORDER — TETRACAINE HCL 0.5 % OP SOLN: 1.0000 [drp] | Freq: Once | OPHTHALMIC | Status: DC

## 2019-06-08 MED ORDER — IBUPROFEN 600 MG PO TABS
600.0000 mg | ORAL_TABLET | Freq: Four times a day (QID) | ORAL | 0 refills | Status: DC | PRN
Start: 2019-06-08 — End: 2019-06-08

## 2019-06-08 MED ORDER — MOXIFLOXACIN HCL 0.5 % OP SOLN
1.0000 [drp] | Freq: Three times a day (TID) | OPHTHALMIC | 0 refills | Status: DC
Start: 2019-06-08 — End: 2019-06-08

## 2019-06-08 MED ORDER — IBUPROFEN 600 MG PO TABS: 600.0000 mg | ORAL_TABLET | Freq: Four times a day (QID) | ORAL | 0 refills | Status: AC | PRN

## 2019-06-08 MED ORDER — EYE WASH OPHTH SOLN
OPHTHALMIC | Status: AC
  Filled 2019-06-08: qty 118

## 2019-06-08 MED ORDER — MOXIFLOXACIN HCL 0.5 % OP SOLN: 1.0000 [drp] | Freq: Three times a day (TID) | OPHTHALMIC | 0 refills | Status: AC

## 2019-06-08 MED ORDER — SYSTANE 0.4-0.3 % OP SOLN: 1.0000 [drp] | Freq: Four times a day (QID) | OPHTHALMIC | 0 refills | Status: AC | PRN

## 2019-06-08 MED ORDER — FLUORESCEIN SODIUM 1 MG OP STRP: 1.0000 | ORAL_STRIP | Freq: Once | OPHTHALMIC | Status: DC

## 2019-06-08 MED ORDER — FLUORESCEIN SODIUM 1 MG OP STRP: 2.0000 | ORAL_STRIP | Freq: Once | OPHTHALMIC | Status: DC

## 2019-06-08 NOTE — Discharge Instructions (Addendum)
Please hand to one of our urgent cares by the [facility] or at [facility] when you have transportation.  We need to evaluate your left thigh in person for severe infection, may redirect you to the emergency room for imaging and it like a CT scan if necessary.  If you come outside of our business hours, you can had to the emergency room as they would be able to do a CT scan there.

## 2019-06-08 NOTE — Discharge Instructions (Signed)
You have a scratch of the eye on the cornea (the clear part of the eye). This condition may be caused by trauma. It is a common problem for people who wear contact lenses. Proper treatment is important. No evidence of infection is noted today, but you could develop an infection called a corneal ulcer or have some retained foreign body that may or may not have been noted today in the UC. Ulcers are not only painful, but they may also scar the cornea and cause permanent damage to the eye.  Do not rub your eyes. You may use Systane or artifical tears as much as you want to for comfort. Wear sunglasses if lights are hurting your eyes.If you wear contact lenses, do not use them until your eye caregiver approves.  Follow-up care is necessary to be sure the corneal abrasion is healing if not completely resolved in 2-3 days. See your caregiver or eye specialist as suggested for followup  Below is a list of primary care practices who are taking new patients for you to follow-up with.  [facility] internal medicine clinic Ground Floor - [facility], [place], [place], [place] [address] [phone]  [facility] Primary Care at [address] [place], [place] [address] [phone]  [place] and [facility] [place], [place] [address] [phone]  [facility]/Family Medicine/Internal Medicine [phone] [place] [address]  [facility]: [place]  [phone]  [place] and [facility]gent [facility]: [place]   [phone]  [facility] Family Medicine: [address] [place]  [phone]  [place] primary care : [address] [place] [phone]  [facility] Primary Care: [address] [place]  [phone]  [facility]: [place] [phone]  Dr. Blanchie Serve [place]  [phone]  Dr. Benito Mccreedy, [facility]. [place],  [address]  [phone]  Go to www.goodrx.com to look up your medications. This will give you a list of where you can find your prescriptions at the most affordable prices. Or ask the pharmacist what the cash price is, or if they have any other discount programs available to help make your medication more affordable. This can be less expensive than what you would pay with insurance.

## 2019-06-08 NOTE — ED Triage Notes (Signed)
Pt presents to UC with pain in her eyes x 1 day. Pt states she forgot to removed her contacts.

## 2019-06-08 NOTE — ED Provider Notes (Signed)
Virtual Visit via Video Note:  Desiree Pearson  initiated request for Telemedicine visit with [facility] Urgent Care team. I connected with Desiree Pearson  on 06/08/2019 at 1:19 PM  for a synchronized telemedicine visit using a video enabled HIPPA compliant telemedicine application. I verified that I am speaking with Desiree Pearson  using two identifiers. Desiree Eagles, PA-C  was physically located in a [facility] Urgent care site and Desiree Pearson was located at a different location.   The limitations of evaluation and management by telemedicine as well as the availability of in-person appointments were discussed. Patient was informed that she  may incur a bill ( including co-pay) for this virtual visit encounter. Desiree Pearson  expressed understanding and gave verbal consent to proceed with virtual visit.   History of Present Illness:Desiree Pearson  is a 34 y.o. female presents with several day hx of acute onset bilateral eye pain, L>R, swelling is worse for her left eye. Patient has difficulty opening her left eye at all. States that she has to force it open.  Patient wears contact lenses and admits that she is not good about removing her contacts but she did take them out since her eye started hurting.  Patient has 2 babies and unfortunately has a difficult time with transportation.   ROS  No current facility-administered medications for this encounter.   Current Outpatient Medications  Medication Sig Dispense Refill  . acetaminophen (TYLENOL) 500 MG tablet Take 1 tablet (500 mg total) by mouth every 6 (six) hours as needed for mild pain. 30 tablet 0  . Blood Pressure Monitoring DEVI 1 Device by Does not apply route once a week. (Patient not taking: Reported on 05/03/2019) 1 Device 0  . cyclobenzaprine (FLEXERIL) 10 MG tablet Take 1 tablet (10 mg total) by mouth 2 (two) times daily as needed for muscle spasms. 10 tablet 0  . ferrous sulfate 325 (65 FE) MG tablet Take 1 tablet (325 mg total) by mouth daily with  breakfast. 60 tablet 0  . folic acid (FOLVITE) 1 MG tablet Take 1 mg by mouth daily.    Marland Kitchen ibuprofen (ADVIL) 800 MG tablet Take 1 tablet (800 mg total) by mouth every 6 (six) hours. 30 tablet 0  . oxyCODONE (OXY IR/ROXICODONE) 5 MG immediate release tablet Take 1-2 tablets (5-10 mg total) by mouth every 4 (four) hours as needed for moderate pain. 30 tablet 0  . polyethylene glycol (MIRALAX / GLYCOLAX) 17 g packet Take 17 g by mouth daily. 14 each 0  . Prenatal Vit-Fe Fumarate-FA (PRENATAL VITAMINS PO) Take 1 tablet by mouth daily.    Marland Kitchen senna-docusate (SENOKOT-S) 8.6-50 MG tablet Take 2 tablets by mouth daily. 30 tablet 0     Allergies  Allergen Reactions  . Morphine Rash and Other (See Comments)    Heart rate dropped  . Sulfur Rash     Past Medical History:  Diagnosis Date  . Chlamydia infection affecting pregnancy 11/29/2018  . Postural orthostatic tachycardia syndrome   . Wolff-Parkinson-White (WPW) syndrome    s/p ablation in 2006    Past Surgical History:  Procedure Laterality Date  . ABLATION  2006   for Wolf parkinson white disease  . CESAREAN SECTION    . CESAREAN SECTION WITH BILATERAL TUBAL LIGATION N/A 05/25/2019   Procedure: CESAREAN SECTION WITH SALPINGECTOMY;  Surgeon: Osborne Oman, MD;  Location: [place] [facility];  Service: Obstetrics;  Laterality: N/A;      Observations/Objective: Physical Exam Patient could not  open my chart from her second visit.  Therefore I had to do visit through phone only.  Assessment and Plan:  PDMP not reviewed this encounter.  1. Eye pain, bilateral   2. Eye swelling, left    Given patient's symptoms setting concern for preseptal cellulitis or orbital cellulitis, lack of being able to visualize her symptoms through video recommended an in person office visit at our urgent care.  I counseled that we may deem it necessary to get an orbital CT scan but patient states that as soon she can get transportation she prefers to go to the  emergency room.   Follow Up Instructions:    I discussed the assessment and treatment plan with the patient. The patient was provided an opportunity to ask questions and all were answered. The patient agreed with the plan and demonstrated an understanding of the instructions.   The patient was advised to call back or seek an in-person evaluation if the symptoms worsen or if the condition fails to improve as anticipated.  I provided 10 minutes of non-face-to-face time during this encounter.    Desiree Eagles, PA-C  06/08/2019 1:19 PM         Desiree Eagles, PA-C 06/08/19 1330

## 2019-06-08 NOTE — ED Provider Notes (Signed)
HPI  SUBJECTIVE:  Desiree Pearson is a 34 y.o. female who presents with bilateral eye pain worse on the left starting yesterday after she took out her contacts.  States that she wore them for 2 months straight without taking them out.  She describes the eye pain as sharp, achy, intermittent, lasting seconds.  She reports increased tearing, photophobia.  No foreign body sensation, fevers, nausea, vomiting, headache, periorbital erythema, edema. no Pain with extraocular movements, visual changes, purulent discharge, crusting.  She tried removing her contacts yesterday which helped her symptoms.  Symptoms worse with opening her eye.  She wears contacts and glasses.  No history of diabetes, hypertension.  LMP: She is 2 weeks postpartum.  She denies the possibility of being pregnant.  PMD: None.  Ophthalmology: None.  Patient had an E-visit earlier today was advised to come in.  Past Medical History:  Diagnosis Date  . Chlamydia infection affecting pregnancy 11/29/2018  . Postural orthostatic tachycardia syndrome   . Wolff-Parkinson-White (WPW) syndrome    s/p ablation in 2006    Past Surgical History:  Procedure Laterality Date  . ABLATION  2006   for Wolf parkinson white disease  . CESAREAN SECTION    . CESAREAN SECTION WITH BILATERAL TUBAL LIGATION N/A 05/25/2019   Procedure: CESAREAN SECTION WITH SALPINGECTOMY;  Surgeon: Osborne Oman, MD;  Location: [facility];  Service: Obstetrics;  Laterality: N/A;    Family History  Problem Relation Age of Onset  . Diabetes Father   . Hypertension Father     Social History   Tobacco Use  . Smoking status: Never Smoker  . Smokeless tobacco: Never Used  Substance Use Topics  . Alcohol use: Not Currently    Comment: occasionally wine  . Drug use: Never     Current Facility-Administered Medications:  .  fluorescein ophthalmic strip 2 strip, 2 strip, Both Eyes, Once, Melynda Ripple, MD .  tetracaine (PONTOCAINE) 0.5 % ophthalmic solution  1-2 drop, 1-2 drop, Both Eyes, Once, Melynda Ripple, MD  Current Outpatient Medications:  .  acetaminophen (TYLENOL) 500 MG tablet, Take 1 tablet (500 mg total) by mouth every 6 (six) hours as needed for mild pain., Disp: 30 tablet, Rfl: 0 .  Blood Pressure Monitoring DEVI, 1 Device by Does not apply route once a week. (Patient not taking: Reported on 05/03/2019), Disp: 1 Device, Rfl: 0 .  cyclobenzaprine (FLEXERIL) 10 MG tablet, Take 1 tablet (10 mg total) by mouth 2 (two) times daily as needed for muscle spasms., Disp: 10 tablet, Rfl: 0 .  ferrous sulfate 325 (65 FE) MG tablet, Take 1 tablet (325 mg total) by mouth daily with breakfast., Disp: 60 tablet, Rfl: 0 .  folic acid (FOLVITE) 1 MG tablet, Take 1 mg by mouth daily., Disp: , Rfl:  .  ibuprofen (ADVIL) 600 MG tablet, Take 1 tablet (600 mg total) by mouth every 6 (six) hours as needed., Disp: 30 tablet, Rfl: 0 .  moxifloxacin (VIGAMOX) 0.5 % ophthalmic solution, Place 1 drop into both eyes 3 (three) times daily. X 7 days, Disp: 3 mL, Rfl: 0 .  oxyCODONE (OXY IR/ROXICODONE) 5 MG immediate release tablet, Take 1-2 tablets (5-10 mg total) by mouth every 4 (four) hours as needed for moderate pain., Disp: 30 tablet, Rfl: 0 .  Polyethyl Glycol-Propyl Glycol (SYSTANE) 0.4-0.3 % SOLN, Apply 1 drop to eye 4 (four) times daily as needed., Disp: 5 mL, Rfl: 0 .  polyethylene glycol (MIRALAX / GLYCOLAX) 17 g packet, Take 17  g by mouth daily., Disp: 14 each, Rfl: 0 .  Prenatal Vit-Fe Fumarate-FA (PRENATAL VITAMINS PO), Take 1 tablet by mouth daily., Disp: , Rfl:  .  senna-docusate (SENOKOT-S) 8.6-50 MG tablet, Take 2 tablets by mouth daily., Disp: 30 tablet, Rfl: 0  Allergies  Allergen Reactions  . Morphine Rash and Other (See Comments)    Heart rate dropped  . Sulfur Rash     ROS  As noted in HPI.   Physical Exam  BP 125/82 (BP Location: Right Arm)   Pulse 99   Temp 98.4 F (36.9 C) (Oral)   Resp 18   LMP 08/25/2018 (Within Days)    SpO2 99%   Constitutional: Well developed, well nourished, no acute distress Eyes:  EOMI, PERRLA.  Positive direct photophobia bilaterally.  No consensual photophobia.  No periorbital erythema, edema.  Positive left conjunctival injection.  No pain with EOMs.  Positive corneal abrasion in the 5 o'clock position seen on fluorescein exam left eye.  Negative Seidel.  No corneal foreign body or foreign body under the lid appreciated.  No hyphema.  No corneal abrasion right eye.    Visual Acuity  Right Eye Distance: 20/30(With correction. ) Left Eye Distance: 20/50(With correction. ) Bilateral Distance: 20/25(With correction. )  Right Eye Near:   Left Eye Near:    Bilateral Near:    HENT: Normocephalic, atraumatic,mucus membranes moist Respiratory: Normal inspiratory effort Cardiovascular: Normal rate GI: nondistended skin: No rash, skin intact Musculoskeletal: no deformities Neurologic: Alert & oriented x 3, no focal neuro deficits Psychiatric: Speech and behavior appropriate   ED Course   Medications  tetracaine (PONTOCAINE) 0.5 % ophthalmic solution 1-2 drop (has no administration in time range)  fluorescein ophthalmic strip 2 strip (has no administration in time range)    Orders Placed This Encounter  Procedures  . Visual acuity screening    Standing Status:   Standing    Number of Occurrences:   1    No results found for this or any previous visit (from the past 24 hour(s)). No results found.  ED Clinical Impression  1. Abrasion of left cornea, initial encounter      ED Assessment/Plan  Patient with a left-sided corneal abrasion secondary to prolonged contact lens use.  Will send home with Vigamox, Tylenol/ibuprofen, cool compresses, Systane, follow-up with Dr. Midge Aver, ophthalmology on-call.  Will also provide primary care list for ongoing care.  Printed out prescriptions.  Patient states her pharmacy on record is closed.  Discussed  MDM, treatment plan, and  plan for follow-up with patient. Discussed sn/sx that should prompt return to the ED. patient agrees with plan.   Meds ordered this encounter  Medications  . DISCONTD: tetracaine (PONTOCAINE) 0.5 % ophthalmic solution 1-2 drop  . DISCONTD: fluorescein ophthalmic strip 1 strip  . tetracaine (PONTOCAINE) 0.5 % ophthalmic solution 1-2 drop  . fluorescein ophthalmic strip 2 strip  . DISCONTD: moxifloxacin (VIGAMOX) 0.5 % ophthalmic solution    Sig: Place 1 drop into both eyes 3 (three) times daily. X 7 days    Dispense:  3 mL    Refill:  0  . DISCONTD: Polyethyl Glycol-Propyl Glycol (SYSTANE) 0.4-0.3 % SOLN    Sig: Apply 1 drop to eye 4 (four) times daily as needed.    Dispense:  5 mL    Refill:  0  . DISCONTD: ibuprofen (ADVIL) 600 MG tablet    Sig: Take 1 tablet (600 mg total) by mouth every 6 (six) hours as needed.  Dispense:  30 tablet    Refill:  0  . ibuprofen (ADVIL) 600 MG tablet    Sig: Take 1 tablet (600 mg total) by mouth every 6 (six) hours as needed.    Dispense:  30 tablet    Refill:  0  . moxifloxacin (VIGAMOX) 0.5 % ophthalmic solution    Sig: Place 1 drop into both eyes 3 (three) times daily. X 7 days    Dispense:  3 mL    Refill:  0  . Polyethyl Glycol-Propyl Glycol (SYSTANE) 0.4-0.3 % SOLN    Sig: Apply 1 drop to eye 4 (four) times daily as needed.    Dispense:  5 mL    Refill:  0    *This clinic note was created using [employer]. Therefore, there may be occasional mistakes despite careful proofreading.   ?    Melynda Ripple, MD 06/10/19 1143

## 2019-06-11 ENCOUNTER — Ambulatory Visit (INDEPENDENT_AMBULATORY_CARE_PROVIDER_SITE_OTHER): Payer: Medicaid Other

## 2019-06-11 ENCOUNTER — Other Ambulatory Visit: Payer: Self-pay

## 2019-06-11 VITALS — BP 116/88 | HR 105 | Wt 189.0 lb

## 2019-06-11 DIAGNOSIS — Z5189 Encounter for other specified aftercare: Secondary | ICD-10-CM

## 2019-06-11 NOTE — Progress Notes (Signed)
Agree with A & P. 

## 2019-06-11 NOTE — Progress Notes (Signed)
Desiree Pearson is here for incision check.  Pt denied pain, redness, and drainage at the site.  Wound looks closed.  No redness or drainage seen.  Pt advised to keep PP appointment on 06/22/19. -EH/RMA

## 2019-06-22 ENCOUNTER — Encounter: Payer: Self-pay | Admitting: Obstetrics and Gynecology

## 2019-06-22 ENCOUNTER — Other Ambulatory Visit: Payer: Self-pay

## 2019-06-22 ENCOUNTER — Ambulatory Visit (INDEPENDENT_AMBULATORY_CARE_PROVIDER_SITE_OTHER): Payer: Medicaid Other | Admitting: Obstetrics and Gynecology

## 2019-06-22 DIAGNOSIS — Z9079 Acquired absence of other genital organ(s): Secondary | ICD-10-CM | POA: Insufficient documentation

## 2019-06-22 DIAGNOSIS — Z1389 Encounter for screening for other disorder: Secondary | ICD-10-CM

## 2019-06-22 DIAGNOSIS — Z9851 Tubal ligation status: Secondary | ICD-10-CM

## 2019-06-22 NOTE — Progress Notes (Signed)
    Gas Partum Visit Note  Desiree Pearson is a 34 y.o. [id number] female who presents for a postpartum visit. She is 4 weeks postpartum following a primary cesarean section.  I have fully reviewed the prenatal and intrapartum course. The delivery was at 56 gestational weeks.  Anesthesia: spinal. Postpartum course has been Unremarkable. Baby is doing well. Baby is feeding by bottle - Jerlyn Ly Start. Bleeding no bleeding. Bowel function is constipation. Bladder function is normal. Patient is not sexually active. Contraception method is tubal ligation. Postpartum depression screening: negative. EPDS = 2   The following portions of the patient's history were reviewed and updated as appropriate: allergies, current medications, past family history, past medical history, past social history, past surgical history and problem list.  Review of Systems Pertinent items are noted in HPI.    Objective:  Last menstrual period 08/25/2018, unknown if currently breastfeeding.  General:  alert   Breasts:  not examined  Lungs: clear to auscultation bilaterally  Heart:  regular rate and rhythm, S1, S2 normal, no murmur, click, rub or gallop  Abdomen: soft, non-tender; bowel sounds normal; no masses,  no organomegaly, incision well healed   Vulva:  not evaluated  Vagina: not evaluated  Cervix:  not examined  Corpus: not examined  Adnexa:  not evaluated  Rectal Exam: Not performed.        Assessment:    Nl postpartum exam. Pap smear not done at today's visit.   Plan:   Essential components of care per ACOG recommendations:  1.  Mood and well being: Patient with negative depression screening today. Reviewed local resources for support.  - Patient does not use tobacco.- hx of drug use? No     2. Infant care and feeding:  -Patient currently breastmilk feeding? No-Social determinants of health (SDOH) reviewed in EPIC. No concerns 3. Sexuality, contraception Pt is s/p bilateral salpingectomy -4. Sleep and  fatigue -Encouraged family/partner/community support of 4 hrs of uninterrupted sleep to help with mood and fatigue  5. Physical Recovery  - Discussed patients delivery- - Patient has urinary incontinence? No- Patient is safe to resume physical and sexual activity  6.  Health Maintenance - Last pap smear done 11/23/2018 and was normal with negative HPV.and + CT    7.Chronic diseases- PCP follow up  Courtney Heys, [place] [facility] [employer], [place]

## 2019-06-22 NOTE — Patient Instructions (Signed)
Health Maintenance, Female Adopting a healthy lifestyle and getting preventive care are important in promoting health and wellness. Ask your health care provider about:  The right schedule for you to have regular tests and exams.  Things you can do on your own to prevent diseases and keep yourself healthy. What should I know about diet, weight, and exercise? Eat a healthy diet   Eat a diet that includes plenty of vegetables, fruits, low-fat dairy products, and lean protein.  Do not eat a lot of foods that are high in solid fats, added sugars, or sodium. Maintain a healthy weight Body mass index (BMI) is used to identify weight problems. It estimates body fat based on height and weight. Your health care provider can help determine your BMI and help you achieve or maintain a healthy weight. Get regular exercise Get regular exercise. This is one of the most important things you can do for your health. Most adults should:  Exercise for at least 150 minutes each week. The exercise should increase your heart rate and make you sweat (moderate-intensity exercise).  Do strengthening exercises at least twice a week. This is in addition to the moderate-intensity exercise.  Spend less time sitting. Even light physical activity can be beneficial. Watch cholesterol and blood lipids Have your blood tested for lipids and cholesterol at 32 years of age, then have this test every 5 years. Have your cholesterol levels checked more often if:  Your lipid or cholesterol levels are high.  You are older than 32 years of age.  You are at high risk for heart disease. What should I know about cancer screening? Depending on your health history and family history, you may need to have cancer screening at various ages. This may include screening for:  Breast cancer.  Cervical cancer.  Colorectal cancer.  Skin cancer.  Lung cancer. What should I know about heart disease, diabetes, and high blood  pressure? Blood pressure and heart disease  High blood pressure causes heart disease and increases the risk of stroke. This is more likely to develop in people who have high blood pressure readings, are of African descent, or are overweight.  Have your blood pressure checked: ? Every 3-5 years if you are 18-39 years of age. ? Every year if you are 40 years old or older. Diabetes Have regular diabetes screenings. This checks your fasting blood sugar level. Have the screening done:  Once every three years after age 40 if you are at a normal weight and have a low risk for diabetes.  More often and at a younger age if you are overweight or have a high risk for diabetes. What should I know about preventing infection? Hepatitis B If you have a higher risk for hepatitis B, you should be screened for this virus. Talk with your health care provider to find out if you are at risk for hepatitis B infection. Hepatitis C Testing is recommended for:  Everyone born from 1945 through 1965.  Anyone with known risk factors for hepatitis C. Sexually transmitted infections (STIs)  Get screened for STIs, including gonorrhea and chlamydia, if: ? You are sexually active and are younger than 32 years of age. ? You are older than 32 years of age and your health care provider tells you that you are at risk for this type of infection. ? Your sexual activity has changed since you were last screened, and you are at increased risk for chlamydia or gonorrhea. Ask your health care provider if   you are at risk.  Ask your health care provider about whether you are at high risk for HIV. Your health care provider may recommend a prescription medicine to help prevent HIV infection. If you choose to take medicine to prevent HIV, you should first get tested for HIV. You should then be tested every 3 months for as long as you are taking the medicine. Pregnancy  If you are about to stop having your period (premenopausal) and  you may become pregnant, seek counseling before you get pregnant.  Take 400 to 800 micrograms (mcg) of folic acid every day if you become pregnant.  Ask for birth control (contraception) if you want to prevent pregnancy. Osteoporosis and menopause Osteoporosis is a disease in which the bones lose minerals and strength with aging. This can result in bone fractures. If you are 65 years old or older, or if you are at risk for osteoporosis and fractures, ask your health care provider if you should:  Be screened for bone loss.  Take a calcium or vitamin D supplement to lower your risk of fractures.  Be given hormone replacement therapy (HRT) to treat symptoms of menopause. Follow these instructions at home: Lifestyle  Do not use any products that contain nicotine or tobacco, such as cigarettes, e-cigarettes, and chewing tobacco. If you need help quitting, ask your health care provider.  Do not use street drugs.  Do not share needles.  Ask your health care provider for help if you need support or information about quitting drugs. Alcohol use  Do not drink alcohol if: ? Your health care provider tells you not to drink. ? You are pregnant, may be pregnant, or are planning to become pregnant.  If you drink alcohol: ? Limit how much you use to 0-1 drink a day. ? Limit intake if you are breastfeeding.  Be aware of how much alcohol is in your drink. In the [place] one drink equals one 12 oz bottle of beer (355 mL), one 5 oz glass of wine (148 mL), or one 1 oz glass of hard liquor (44 mL). General instructions  Schedule regular health, dental, and eye exams.  Stay current with your vaccines.  Tell your health care provider if: ? You often feel depressed. ? You have ever been abused or do not feel safe at home. Summary  Adopting a healthy lifestyle and getting preventive care are important in promoting health and wellness.  Follow your health care provider's instructions about healthy  diet, exercising, and getting tested or screened for diseases.  Follow your health care provider's instructions on monitoring your cholesterol and blood pressure. This information is not intended to replace advice given to you by your health care provider. Make sure you discuss any questions you have with your health care provider. Document Revised: 01/11/2018 Document Reviewed: 01/11/2018 Elsevier Patient Education  2020 [employer]  

## 2019-09-26 ENCOUNTER — Telehealth: Payer: Self-pay

## 2019-09-26 DIAGNOSIS — M25531 Pain in right wrist: Secondary | ICD-10-CM | POA: Diagnosis not present

## 2019-09-26 NOTE — Telephone Encounter (Signed)
Patient called [facility] reporting severe abdominal pain, diarrhea, and body aches x2 days. Patient reports her PCP instructed her to call us for COVID testing. Patient states abdominal pain is severe enough it is causing her to be doubled over. Discussed with Faustino Congress, NP, who recommended patient go to the [facility] ED for imaging d/t severity of pain.   Informed patient of NP's recommendation and patient became upset. Patient stated "so you won't see me because I'm having COVID symptoms?" Informed patient that it was due to the severity of her abdominal pain we recommend evaluation in the ED. Patient was still upset and hung up on this RN.

## 2020-01-09 ENCOUNTER — Encounter: Payer: Self-pay | Admitting: General Practice

## 2021-05-17 ENCOUNTER — Other Ambulatory Visit: Payer: Self-pay

## 2021-05-17 ENCOUNTER — Emergency Department (HOSPITAL_COMMUNITY): Payer: Medicaid Other

## 2021-05-17 ENCOUNTER — Emergency Department (HOSPITAL_COMMUNITY)
Admission: EM | Admit: 2021-05-17 | Discharge: 2021-05-17 | Disposition: A | Discharge status: Home / Self Care | Payer: Medicaid Other | Attending: Emergency Medicine | Admitting: Emergency Medicine

## 2021-05-17 ENCOUNTER — Encounter (HOSPITAL_COMMUNITY): Payer: Self-pay | Admitting: Emergency Medicine

## 2021-05-17 DIAGNOSIS — R059 Cough, unspecified: Secondary | ICD-10-CM | POA: Insufficient documentation

## 2021-05-17 DIAGNOSIS — N9489 Other specified conditions associated with female genital organs and menstrual cycle: Secondary | ICD-10-CM | POA: Insufficient documentation

## 2021-05-17 DIAGNOSIS — N760 Acute vaginitis: Secondary | ICD-10-CM | POA: Diagnosis not present

## 2021-05-17 DIAGNOSIS — B9689 Other specified bacterial agents as the cause of diseases classified elsewhere: Secondary | ICD-10-CM | POA: Insufficient documentation

## 2021-05-17 DIAGNOSIS — R109 Unspecified abdominal pain: Secondary | ICD-10-CM | POA: Diagnosis present

## 2021-05-17 DIAGNOSIS — A64 Unspecified sexually transmitted disease: Secondary | ICD-10-CM | POA: Diagnosis not present

## 2021-05-17 DIAGNOSIS — M545 Low back pain, unspecified: Secondary | ICD-10-CM

## 2021-05-17 DIAGNOSIS — R1030 Lower abdominal pain, unspecified: Secondary | ICD-10-CM

## 2021-05-17 LAB — URINALYSIS, ROUTINE W REFLEX MICROSCOPIC
Bilirubin Urine: NEGATIVE
Glucose, UA: NEGATIVE mg/dL
Hgb urine dipstick: NEGATIVE
Ketones, ur: NEGATIVE mg/dL
Nitrite: NEGATIVE
Protein, ur: NEGATIVE mg/dL
Specific Gravity, Urine: 1.018 (ref 1.005–1.030)
pH: 6 (ref 5.0–8.0)

## 2021-05-17 LAB — COMPREHENSIVE METABOLIC PANEL
ALT: 28 U/L (ref 0–44)
AST: 25 U/L (ref 15–41)
Albumin: 3.7 g/dL (ref 3.5–5.0)
Alkaline Phosphatase: 67 U/L (ref 38–126)
Anion gap: 7 (ref 5–15)
BUN: 6 mg/dL (ref 6–20)
CO2: 21 mmol/L — ABNORMAL LOW (ref 22–32)
Calcium: 9 mg/dL (ref 8.9–10.3)
Chloride: 107 mmol/L (ref 98–111)
Creatinine, Ser: 0.91 mg/dL (ref 0.44–1.00)
GFR, Estimated: >60 mL/min (ref >60)
Glucose, Bld: 94 mg/dL (ref 70–99)
Potassium: 4 mmol/L (ref 3.5–5.1)
Sodium: 135 mmol/L (ref 135–145)
Total Bilirubin: 0.5 mg/dL (ref 0.3–1.2)
Total Protein: 7.9 g/dL (ref 6.5–8.1)

## 2021-05-17 LAB — WET PREP, GENITAL
Sperm: NONE SEEN
Trich, Wet Prep: NONE SEEN
WBC, Wet Prep HPF POC: <10 (ref <10)
Yeast Wet Prep HPF POC: NONE SEEN

## 2021-05-17 LAB — CBC
HCT: 38.2 % (ref 36.0–46.0)
Hemoglobin: 10.8 g/dL — ABNORMAL LOW (ref 12.0–15.0)
MCH: 21.7 pg — ABNORMAL LOW (ref 26.0–34.0)
MCHC: 28.3 g/dL — ABNORMAL LOW (ref 30.0–36.0)
MCV: 76.7 fL — ABNORMAL LOW (ref 80.0–100.0)
Platelets: 195 10*3/uL (ref 150–400)
RBC: 4.98 MIL/uL (ref 3.87–5.11)
RDW: 16.9 % — ABNORMAL HIGH (ref 11.5–15.5)
WBC: 4.9 10*3/uL (ref 4.0–10.5)
nRBC: 0 % (ref 0.0–0.2)

## 2021-05-17 LAB — I-STAT BETA HCG BLOOD, ED (MC, WL, AP ONLY): I-stat hCG, quantitative: <5 m[IU]/mL (ref <5)

## 2021-05-17 LAB — LIPASE, BLOOD: Lipase: 32 U/L (ref 11–51)

## 2021-05-17 LAB — HIV ANTIBODY (ROUTINE TESTING W REFLEX): HIV Screen 4th Generation wRfx: NONREACTIVE

## 2021-05-17 LAB — TROPONIN I (HIGH SENSITIVITY)
Troponin I (High Sensitivity): 2 ng/L (ref <18)
Troponin I (High Sensitivity): 2 ng/L (ref <18)

## 2021-05-17 LAB — POC OCCULT BLOOD, ED: Fecal Occult Bld: NEGATIVE

## 2021-05-17 MED ORDER — IOHEXOL 300 MG/ML  SOLN
100.0000 mL | Freq: Once | INTRAMUSCULAR | Status: AC | PRN
  Administered 2021-05-17: 100 mL via INTRAVENOUS

## 2021-05-17 MED ORDER — DOXYCYCLINE HYCLATE 100 MG PO CAPS: 100.0000 mg | ORAL_CAPSULE | Freq: Two times a day (BID) | ORAL | 0 refills | Status: AC

## 2021-05-17 MED ORDER — METRONIDAZOLE 500 MG PO TABS: 500.0000 mg | ORAL_TABLET | Freq: Two times a day (BID) | ORAL | 0 refills | Status: AC

## 2021-05-17 MED ORDER — LIDOCAINE HCL (PF) 1 % IJ SOLN
INTRAMUSCULAR | Status: AC
  Filled 2021-05-17: qty 5

## 2021-05-17 MED ORDER — CEFTRIAXONE SODIUM 500 MG IJ SOLR
500.0000 mg | Freq: Once | INTRAMUSCULAR | Status: AC
  Administered 2021-05-17: 500 mg via INTRAMUSCULAR
  Filled 2021-05-17: qty 500

## 2021-05-17 NOTE — ED Triage Notes (Addendum)
Reports cough x 1 year, bright red blood in stool x 6 months, abd pain and back pain since having her child 2 years ago, and intermittent episodes of "heavy breathing" and chest pain. ?

## 2021-05-17 NOTE — Discharge Instructions (Addendum)
You were seen today for multiple complaints.  No evidence of pneumonia or infection.  No signs of heart damage.  Your abdominal CT scan shows no acute findings.  You may have mild diverticulosis.  I have ordered a prescription for you of doxycycline.  I recommend that you check MyChart online for results of STI testing.  If positive for gonorrhea/chlamydia, please fill and take the doxycycline prescription. Recommend follow up with provided clinic for help with establishing primary care ?

## 2021-05-17 NOTE — ED Provider Notes (Signed)
?[facility] ?Provider Note ? ? ?CSN: [id number] ?Arrival date & time: 05/17/21  4503 ? ?  ? ?History ?Chief Complaint  ?Patient presents with  ? Cough  ? Abdominal Pain  ? ? ?Desiree Pearson is a 36 y.o. female.  Patient presents to the emergency department with multiple complaints this morning.  Patient states that her chief complaint is abdominal pain with bright red blood in stool.  Endorses occasional constipation.  Denies diarrhea, nausea, vomiting.  Abdominal pain is most severe in the left lower quadrant, the patient also complains of pain across the rest of the lower abdomen.  Patient complains of lumbar back pain.  Patient states that this has been ongoing for 2 years.  Patient complains of over 1 year of cough.  Patient also complained of generalized chest pain.  She denies radiation of chest pain symptoms.  Past medical history significant for history of bilateral salpingectomy, POTS, WPW, depressive disorder, thalassemia alpha carrier. ? ?HPI ? ?  ? ?Home Medications ?Prior to Admission medications   ?Medication Sig Start Date End Date Taking? Authorizing Provider  ?doxycycline (VIBRAMYCIN) 100 MG capsule Take 1 capsule (100 mg total) by mouth 2 (two) times daily for 7 days. 05/17/21 05/24/21 Yes Dorothyann Peng, PA-C  ?metroNIDAZOLE (FLAGYL) 500 MG tablet Take 1 tablet (500 mg total) by mouth 2 (two) times daily. 05/17/21  Yes Dorothyann Peng, PA-C  ?acetaminophen (TYLENOL) 500 MG tablet Take 1 tablet (500 mg total) by mouth every 6 (six) hours as needed for mild pain. ?Patient not taking: Reported on 06/22/2019 05/28/19   Chauncey Mann, MD  ?Blood Pressure Monitoring DEVI 1 Device by Does not apply route once a week. ?Patient not taking: Reported on 05/03/2019 11/23/18   Woodroe Mode, MD  ?cyclobenzaprine (FLEXERIL) 10 MG tablet Take 1 tablet (10 mg total) by mouth 2 (two) times daily as needed for muscle spasms. 05/04/19   Gavin Pound, CNM  ?ferrous sulfate 325 (65  FE) MG tablet Take 1 tablet (325 mg total) by mouth daily with breakfast. 05/28/19   Fair, Marin Shutter, MD  ?folic acid (FOLVITE) 1 MG tablet Take 1 mg by mouth daily.    [provider]  ?ibuprofen (ADVIL) 600 MG tablet Take 1 tablet (600 mg total) by mouth every 6 (six) hours as needed. ?Patient not taking: Reported on 06/22/2019 06/08/19   Melynda Ripple, MD  ?moxifloxacin (VIGAMOX) 0.5 % ophthalmic solution Place 1 drop into both eyes 3 (three) times daily. X 7 days ?Patient not taking: Reported on 06/22/2019 06/08/19   Melynda Ripple, MD  ?oxyCODONE (OXY IR/ROXICODONE) 5 MG immediate release tablet Take 1-2 tablets (5-10 mg total) by mouth every 4 (four) hours as needed for moderate pain. 05/28/19   Fair, Marin Shutter, MD  ?Polyethyl Glycol-Propyl Glycol (SYSTANE) 0.4-0.3 % SOLN Apply 1 drop to eye 4 (four) times daily as needed. ?Patient not taking: Reported on 06/22/2019 06/08/19   Melynda Ripple, MD  ?polyethylene glycol (MIRALAX / GLYCOLAX) 17 g packet Take 17 g by mouth daily. 05/28/19   FairMarin Shutter, MD  ?Prenatal Vit-Fe Fumarate-FA (PRENATAL VITAMINS PO) Take 1 tablet by mouth daily.    [provider]  ?senna-docusate (SENOKOT-S) 8.6-50 MG tablet Take 2 tablets by mouth daily. 05/29/19   Chauncey Mann, MD  ?   ? ?Allergies    ?Morphine and Elemental sulfur   ? ?Review of Systems   ?Review of Systems  ?Constitutional:  Negative for  fever.  ?Respiratory:  Positive for cough. Negative for shortness of breath.   ?Cardiovascular:  Positive for chest pain.  ?Gastrointestinal:  Positive for abdominal pain, blood in stool and constipation. Negative for diarrhea, nausea and vomiting.  ?Genitourinary:  Positive for hematuria. Negative for dysuria and vaginal discharge.  ?Musculoskeletal:  Positive for back pain.  ? ?Physical Exam ?Updated Vital Signs ?BP (!) 132/93   Pulse 85   Temp 98.6 ?F (37 ?C) (Oral)   Resp 16   LMP 05/02/2021 Comment: tubal ligation  SpO2 100%  ?Physical Exam ?Vitals and  nursing note reviewed. Exam conducted with a chaperone present.  ?Constitutional:   ?   General: She is not in acute distress. ?   Appearance: She is well-developed.  ?HENT:  ?   Head: Normocephalic and atraumatic.  ?Eyes:  ?   Extraocular Movements: Extraocular movements intact.  ?Cardiovascular:  ?   Rate and Rhythm: Normal rate and regular rhythm.  ?   Heart sounds: Normal heart sounds.  ?Pulmonary:  ?   Effort: Pulmonary effort is normal. No respiratory distress.  ?   Breath sounds: Normal breath sounds. No wheezing.  ?Abdominal:  ?   General: Abdomen is flat. Bowel sounds are normal. There is no distension.  ?   Palpations: Abdomen is soft.  ?   Tenderness: There is abdominal tenderness (Most severe LLQ) in the right lower quadrant, suprapubic area and left lower quadrant. There is no right CVA tenderness or left CVA tenderness.  ?Genitourinary: ?   Vagina: Vaginal discharge and tenderness present.  ?   Cervix: No cervical motion tenderness or discharge.  ?   Adnexa:     ?   Right: No tenderness.      ?   Left: No tenderness.    ?   Rectum: Guaiac result negative.  ?Musculoskeletal:     ?   General: No tenderness.  ?   Cervical back: Normal range of motion.  ?   Comments: Pain in lumbar region with general movement. No midline tenderness to palpation along thoracic or lumbar spine  ?Skin: ?   General: Skin is warm and dry.  ?Neurological:  ?   Mental Status: She is alert and oriented to person, place, and time.  ? ? ?ED Results / Procedures / Treatments   ?Labs ?(all labs ordered are listed, but only abnormal results are displayed) ?Labs Reviewed  ?WET PREP, GENITAL - Abnormal; Notable for the following components:  ?    Result Value  ? Clue Cells Wet Prep HPF POC PRESENT (*)   ? All other components within normal limits  ?COMPREHENSIVE METABOLIC PANEL - Abnormal; Notable for the following components:  ? CO2 21 (*)   ? All other components within normal limits  ?CBC - Abnormal; Notable for the following  components:  ? Hemoglobin 10.8 (*)   ? MCV 76.7 (*)   ? MCH 21.7 (*)   ? MCHC 28.3 (*)   ? RDW 16.9 (*)   ? All other components within normal limits  ?URINALYSIS, ROUTINE W REFLEX MICROSCOPIC - Abnormal; Notable for the following components:  ? APPearance CLOUDY (*)   ? Leukocytes,Ua LARGE (*)   ? Bacteria, UA RARE (*)   ? All other components within normal limits  ?LIPASE, BLOOD  ?HIV ANTIBODY (ROUTINE TESTING W REFLEX)  ?RPR  ?I-STAT BETA HCG BLOOD, ED ([facility], [facility], [facility] ONLY)  ?POC OCCULT BLOOD, ED  ?GC/CHLAMYDIA PROBE AMP ([place]) NOT AT [facility]  ?  TROPONIN I (HIGH SENSITIVITY)  ?TROPONIN I (HIGH SENSITIVITY)  ? ? ?EKG ?None ? ?Radiology ?DG Chest 2 View ? ?Result Date: 05/17/2021 ?CLINICAL DATA:  36 year old female with chest pain shortness of breath and cough. EXAM: CHEST - 2 VIEW COMPARISON:  None. FINDINGS: Normal lung volumes and mediastinal contours. Visualized tracheal air column is within normal limits. Both lungs appear clear. No pneumothorax or pleural effusion. No osseous abnormality identified. Negative visible bowel gas pattern. IMPRESSION: Negative.  No cardiopulmonary abnormality. Electronically Signed   By: Genevie Ann M.D.   On: 05/17/2021 08:33  ? ?CT ABDOMEN PELVIS W CONTRAST ? ?Result Date: 05/17/2021 ?CLINICAL DATA:  Abdominal pain EXAM: CT ABDOMEN AND PELVIS WITH CONTRAST TECHNIQUE: Multidetector CT imaging of the abdomen and pelvis was performed using the standard protocol following bolus administration of intravenous contrast. RADIATION DOSE REDUCTION: This exam was performed according to the departmental dose-optimization program which includes automated exposure control, adjustment of the mA and/or kV according to patient size and/or use of iterative reconstruction technique. CONTRAST:  146m OMNIPAQUE IOHEXOL 300 MG/ML  SOLN COMPARISON:  None. FINDINGS: Lower chest: No acute abnormality. Hepatobiliary: Liver is normal in size and contour with no suspicious mass identified. Small area of focal  fatty infiltration adjacent to the falciform ligament anteriorly. Gallbladder is normal. No biliary ductal dilatation. Pancreas: Unremarkable. No pancreatic ductal dilatation or surrounding inflammatory changes. Spl

## 2021-05-18 LAB — GC/CHLAMYDIA PROBE AMP (~~LOC~~) NOT AT ARMC
Chlamydia: NEGATIVE
Comment: NEGATIVE
Comment: NORMAL
Neisseria Gonorrhea: NEGATIVE

## 2021-05-18 LAB — RPR: RPR Ser Ql: NONREACTIVE

## 2021-05-18 IMAGING — US US MFM FETAL BPP W/O NON-STRESS
1 series · 15 of 16 positions shown · non-contrast
Comparison: none

[Series 1: us mfm fetal bpp w/o non-stress · 16 acquisitions, 15 frames shown]
[im 1/16]
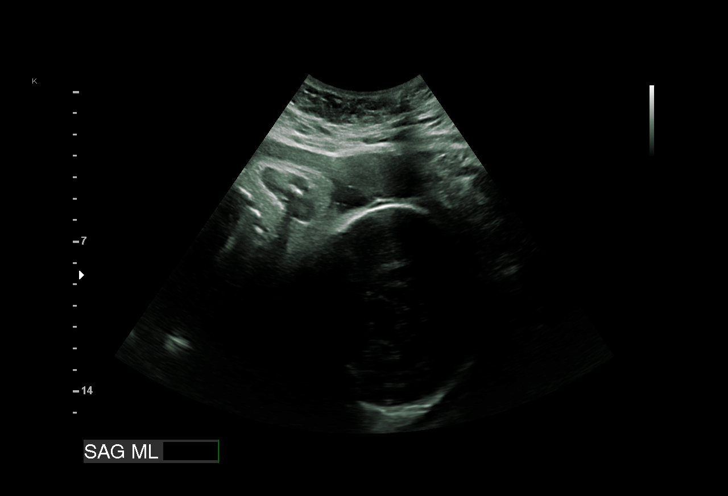
[im 2/16]
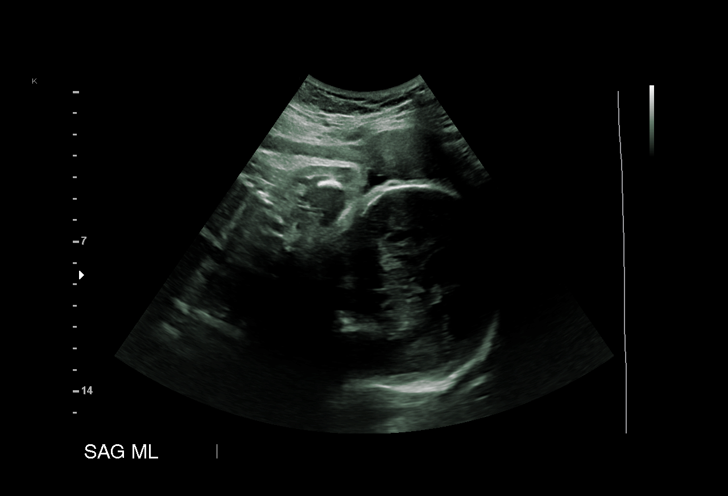
[im 3/16]
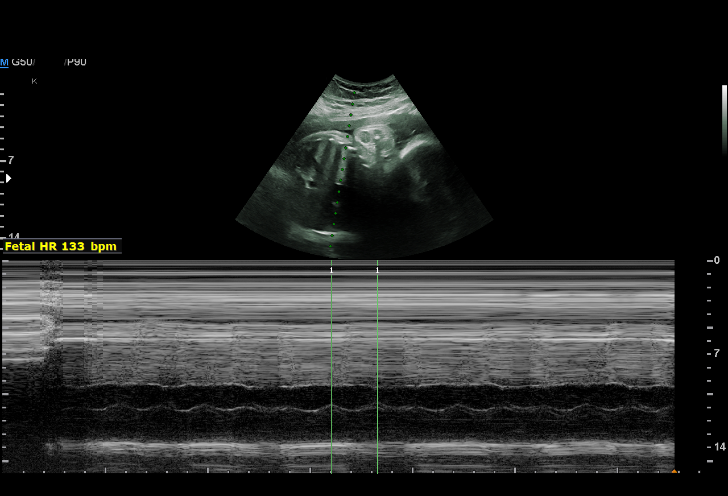
[im 4/16]
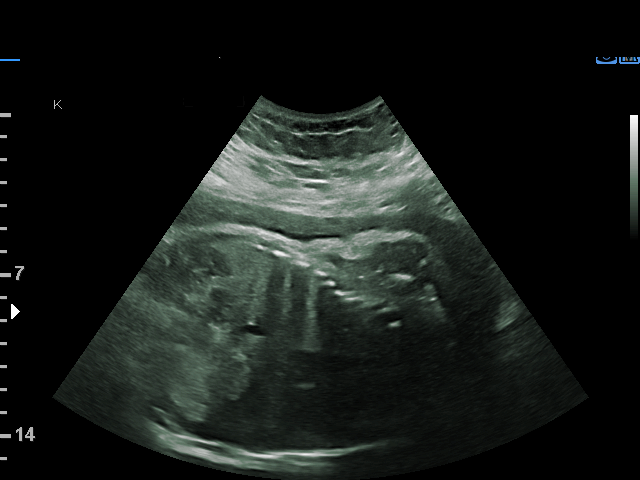
[im 5/16]
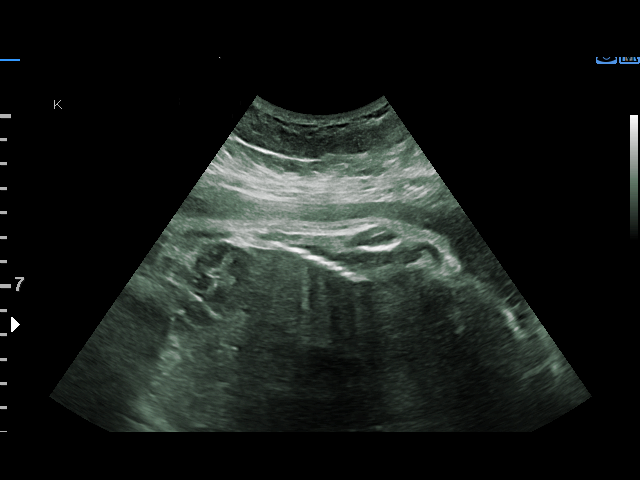
[im 6/16]
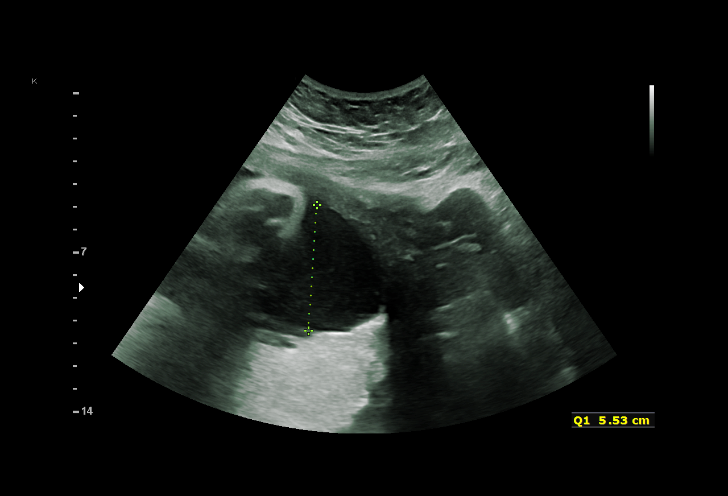
[im 7/16]
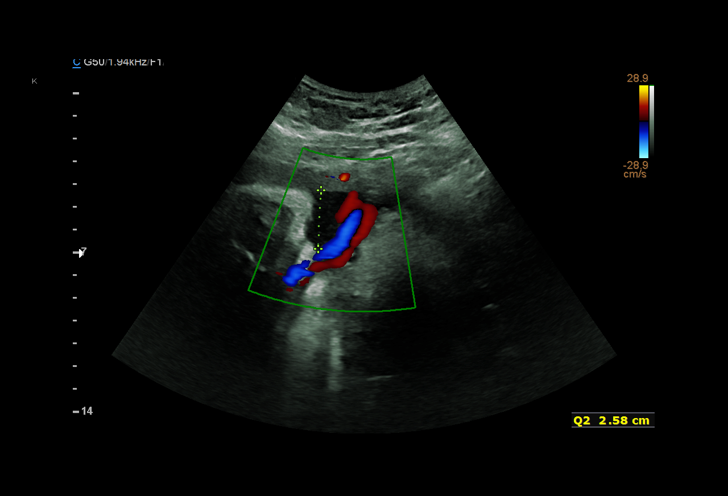
[im 9/16]
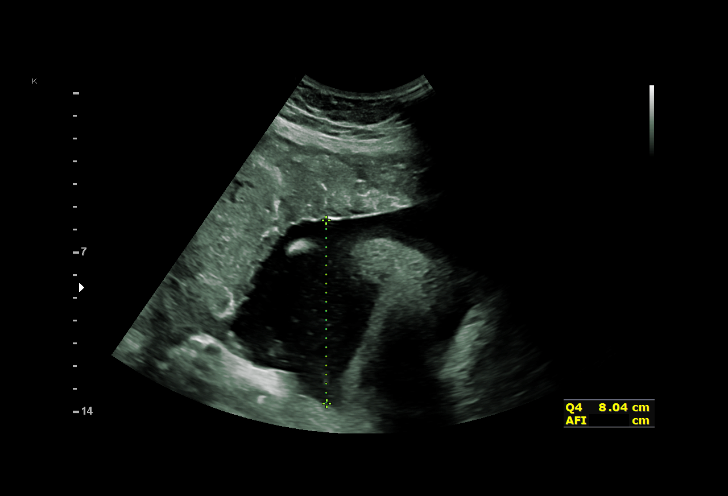
[im 10/16]
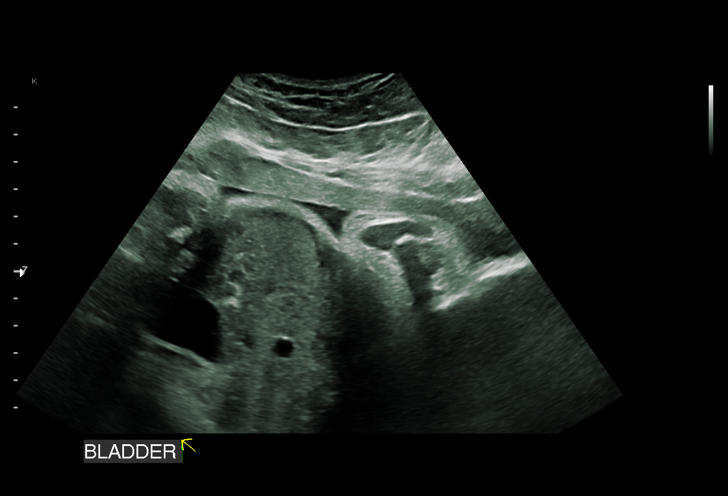
[im 11/16]
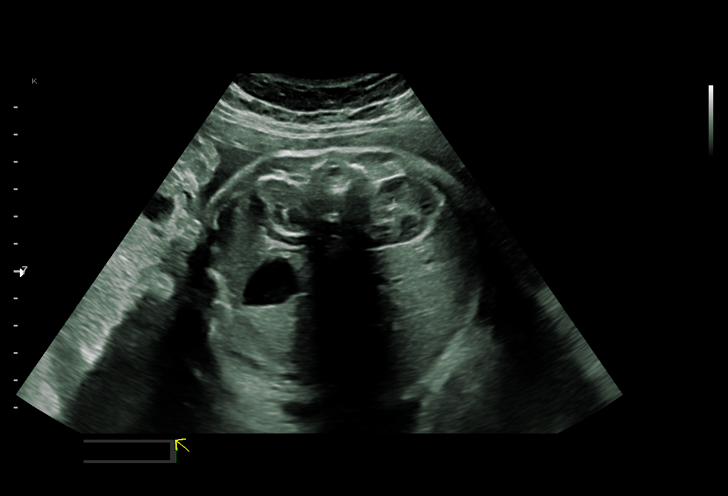
[im 12/16]
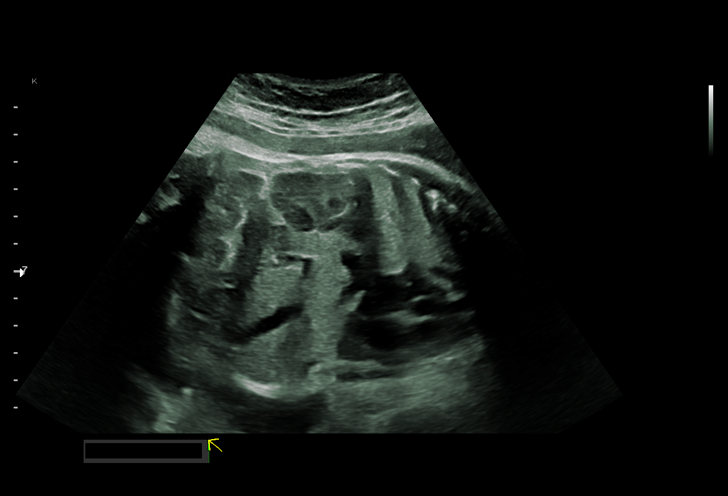
[im 13/16]
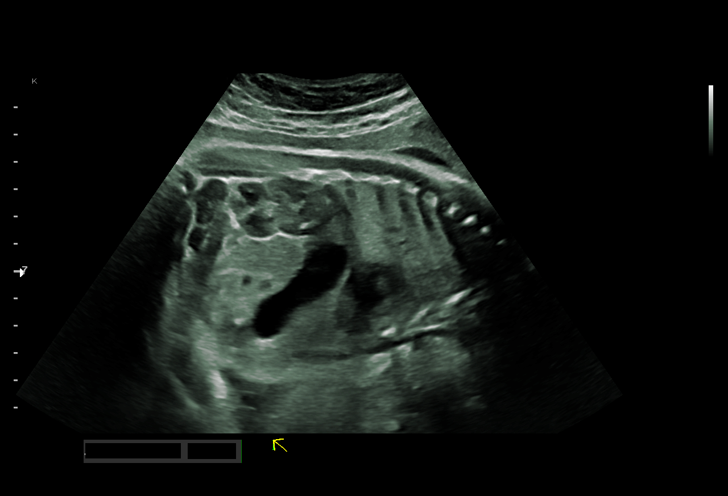
[im 14/16]
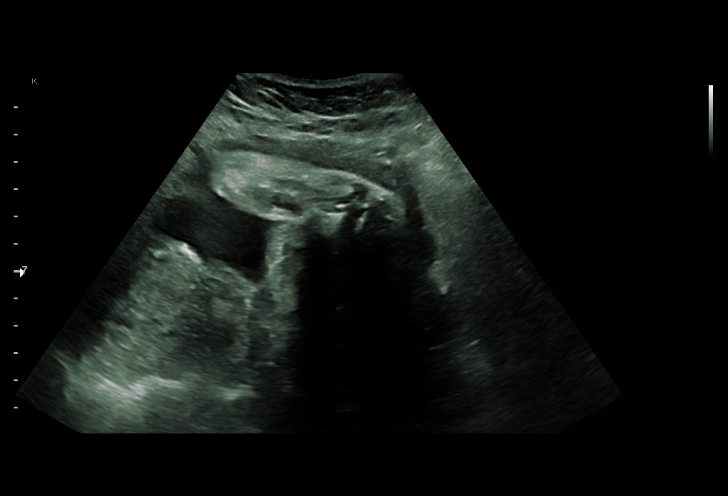
[im 15/16]
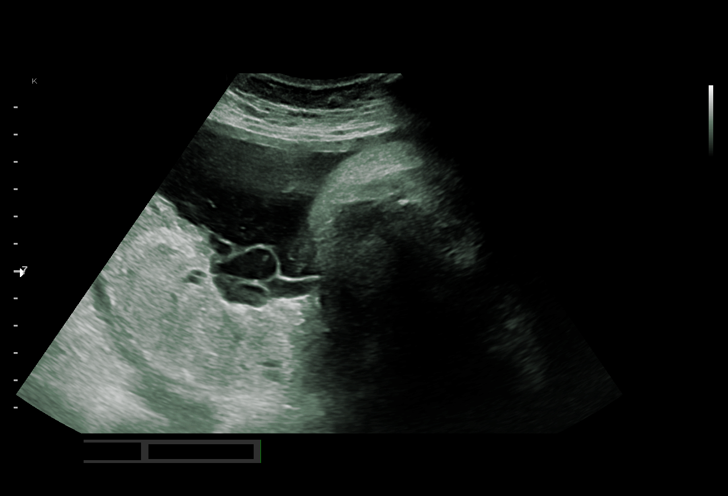
[im 16/16]
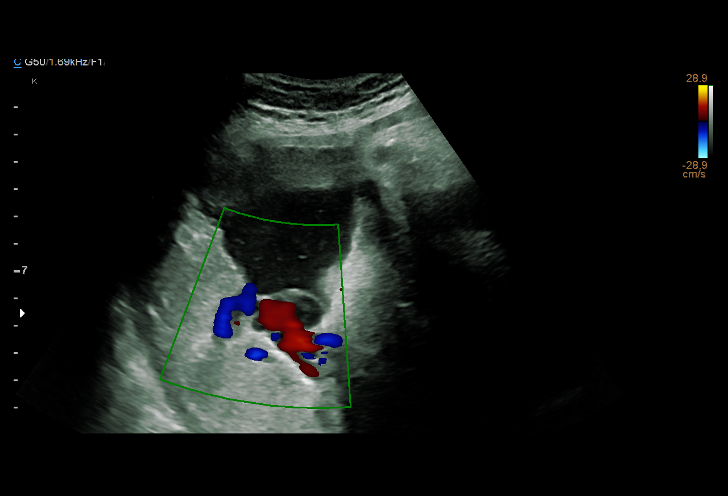

[15 of 16 positions shown; findings below may reference images not displayed]

----------------------------------------------------------------------

 ----------------------------------------------------------------------
Indications

  Non-reactive NST
  36 weeks gestation of pregnancy
  Obesity complicating pregnancy, second
  trimester (prepreg BMI 36.21)
  Medical complication of pregnancy (WPW
  syndrome, POTS)
  Family history of congenital anomaly (heart
  defects)
  Genetic carrier (Prelac Balug.)
  Low Risk NIPS
  Uterine fibroids
 ----------------------------------------------------------------------
Fetal Evaluation

 Num Of Fetuses:          1
 Fetal Heart Rate(bpm):   133
 Cardiac Activity:        Observed
 Presentation:            Cephalic

 Amniotic Fluid
 AFI FV:      Within normal limits

 AFI Sum(cm)     %Tile       Largest Pocket(cm)
 18.15           68

 RUQ(cm)       RLQ(cm)       LUQ(cm)        LLQ(cm)
 5.53          8.04          2.58           2
Biophysical Evaluation
 Amniotic F.V:   Within normal limits       F. Tone:         Observed
 F. Movement:    Observed                   Score:           [DATE]
 F. Breathing:   Observed
OB History

 Gravidity:    2         Term:   1        Prem:   0        SAB:   0
 TOP:          0       Ectopic:  0        Living: 1
Gestational Age

 LMP:           36w 0d        Date:  08/25/18                 EDD:   06/01/19
 Best:          36w 0d     Det. By:  LMP  (08/25/18)          EDD:   06/01/19
Anatomy

 Stomach:               Appears normal, left   Bladder:                Appears normal
                        sided
 Kidneys:               Appear normal
Comments

 A biophysical profile performed today was [DATE].
 There was normal amniotic fluid noted on today's ultrasound
 exam.

## 2021-05-19 ENCOUNTER — Telehealth: Payer: Self-pay

## 2021-05-19 NOTE — Telephone Encounter (Signed)
Copied from [place] [id number]. Topic: Appointment Scheduling - Scheduling Inquiry for Clinic ?>> May 19, 2021 12:20 PM Oneta Rack wrote: ?Reason for CRM: Patient was seen at [facility] on 05/17/2021 and advised to follow up with [facility] as soon as possible; Patient is needing a hospital follow up within the discharged time frame ?

## 2021-05-21 NOTE — Telephone Encounter (Signed)
The patient has made an additional call regarding this scheduling matter ? ?Please contact further  ?

## 2021-05-21 NOTE — Telephone Encounter (Signed)
Will forward to scheduling.  ?

## 2021-07-15 ENCOUNTER — Ambulatory Visit: Payer: Medicaid Other | Admitting: Emergency Medicine

## 2022-09-07 ENCOUNTER — Other Ambulatory Visit: Payer: Self-pay | Admitting: Internal Medicine

## 2022-09-07 ENCOUNTER — Encounter: Payer: Self-pay | Admitting: Internal Medicine

## 2022-09-07 DIAGNOSIS — N644 Mastodynia: Secondary | ICD-10-CM

## 2022-09-20 ENCOUNTER — Encounter (HOSPITAL_COMMUNITY): Payer: Self-pay

## 2022-09-20 ENCOUNTER — Other Ambulatory Visit: Payer: Self-pay

## 2022-09-20 ENCOUNTER — Emergency Department (HOSPITAL_COMMUNITY)
Admission: EM | Admit: 2022-09-20 | Discharge: 2022-09-20 | Disposition: A | Discharge status: Home / Self Care | Payer: Medicaid Other | Attending: Emergency Medicine | Admitting: Emergency Medicine

## 2022-09-20 ENCOUNTER — Emergency Department (HOSPITAL_COMMUNITY): Payer: Medicaid Other

## 2022-09-20 DIAGNOSIS — R0789 Other chest pain: Secondary | ICD-10-CM | POA: Insufficient documentation

## 2022-09-20 DIAGNOSIS — R079 Chest pain, unspecified: Secondary | ICD-10-CM

## 2022-09-20 LAB — CBC
HCT: 34.1 % — ABNORMAL LOW (ref 36.0–46.0)
Hemoglobin: 9.5 g/dL — ABNORMAL LOW (ref 12.0–15.0)
MCH: 19.9 pg — ABNORMAL LOW (ref 26.0–34.0)
MCHC: 27.9 g/dL — ABNORMAL LOW (ref 30.0–36.0)
MCV: 71.3 fL — ABNORMAL LOW (ref 80.0–100.0)
Platelets: 218 10*3/uL (ref 150–400)
RBC: 4.78 MIL/uL (ref 3.87–5.11)
RDW: 19.7 % — ABNORMAL HIGH (ref 11.5–15.5)
WBC: 6.8 10*3/uL (ref 4.0–10.5)
nRBC: 0 % (ref 0.0–0.2)

## 2022-09-20 LAB — BASIC METABOLIC PANEL
Anion gap: 12 (ref 5–15)
BUN: 5 mg/dL — ABNORMAL LOW (ref 6–20)
CO2: 21 mmol/L — ABNORMAL LOW (ref 22–32)
Calcium: 9 mg/dL (ref 8.9–10.3)
Chloride: 101 mmol/L (ref 98–111)
Creatinine, Ser: 0.88 mg/dL (ref 0.44–1.00)
GFR, Estimated: >60 mL/min (ref >60)
Glucose, Bld: 92 mg/dL (ref 70–99)
Potassium: 3.9 mmol/L (ref 3.5–5.1)
Sodium: 134 mmol/L — ABNORMAL LOW (ref 135–145)

## 2022-09-20 LAB — TROPONIN I (HIGH SENSITIVITY)
Troponin I (High Sensitivity): 3 ng/L (ref <18)
Troponin I (High Sensitivity): 2 ng/L (ref <18)
Troponin I (High Sensitivity): <2 ng/L (ref <18)

## 2022-09-20 LAB — HCG, SERUM, QUALITATIVE: Preg, Serum: NEGATIVE

## 2022-09-20 MED ORDER — ACETAMINOPHEN 500 MG PO TABS
1000.0000 mg | ORAL_TABLET | Freq: Once | ORAL | Status: DC
  Filled 2022-09-20: qty 2

## 2022-09-20 MED ORDER — MIDAZOLAM HCL (PF) 10 MG/2ML IJ SOLN
INTRAMUSCULAR | Status: AC
  Filled 2022-09-20: qty 2

## 2022-09-20 NOTE — ED Notes (Signed)
Family is at bedside. Patient states she is having chest pain and her left arm is hurting. Family also states that she cannot open her left eye. MD was made aware.

## 2022-09-20 NOTE — ED Provider Notes (Signed)
EMERGENCY DEPARTMENT AT [facility] Provider Note  MDM   HPI/ROS:  Desiree Pearson is a 37 y.o. female with medical history of POTS presenting with chief complaint of chest pain.  Patient claims she experienced a sharp, tingling and stabbing pain that started this morning.  She also experienced some associated shortness of breath, left arm pain, inability to open her right eye, seizures, and loss of her voice.  She is not overly cooperative with history taking but is able to provide basic details.  Denies recent fevers, cough, congestion, infectious symptoms.  Physical exam is notable for: -Overall well-appearing, no acute distress - GCS 15, follows commands and answers questions appropriately - Neurologically afocal - Cardiopulmonary exam benign aside from tenderness to palpation of chest wall - No lower extremity edema  On my initial evaluation, patient is:  -Vital signs stable. Patient afebrile, hemodynamically stable, and non-toxic appearing. -Additional history obtained from family member at bedside  Given the patient's history and physical exam, differential diagnosis includes but is not limited to ACS, PE, aortic dissection, other viral illness, pericarditis, seizures, etc.  Interpretations, interventions, and the patient's course of care are documented below.    Initial workup obtained in triage includes EKG, CBC, BMP, troponin, hCG, chest x-ray.  Immediately after being bedded, patient experienced bilateral leg shaking.  Saline was squirted in her eye she quickly reacted and was making purposeful movements with her upper extremities.  Exceedingly low concern for seizures.  No benzos given.  Patient then complaining of inability to open her right eye and loss of her voice.  Workup resulted negative with EKG without ischemic changes, interval disturbances, conduction blocks.  Multiple troponins resulted negative.  Remainder of workup resulted within normal limits  aside from hemoglobin 9.5, baseline per chart review.  Chest x-ray without acute pathology.  Upon reassessment, patient still complaining of symptoms saying she needs to be admitted to the hospital.  Vital signs remained stable.  Satting 100% on room air, blood pressure 128/81, afebrile, not tachycardic.  Explained to patient that her workup is reassuring and there is no indication for admission to the hospital.  Patient still complaining of inability to open her right eye.  I opened her right eye and she deliberately closes against my resistance.  Exceedingly low concern for neurologic pathology.  Patient was instructed to follow-up with primary care provider.  Discharged in stable condition.  Disposition:  I discussed the plan for discharge with the patient and/or their surrogate at bedside prior to discharge and they were in agreement with the plan and verbalized understanding of the return precautions provided. All questions answered to the best of my ability. Ultimately, the patient was discharged in stable condition with stable vital signs. I am reassured that they are capable of close follow up and good social support at home.   Clinical Impression:  1. Chest pain, unspecified type     Rx / DC Orders ED Discharge Orders     None       The plan for this patient was discussed with Dr. Suezanne Jacquet, who voiced agreement and who oversaw evaluation and treatment of this patient.   Clinical Complexity A medically appropriate history, review of systems, and physical exam was performed.  My independent interpretations of EKG, labs, and radiology are documented in the ED course above.   Click here for ABCD2, HEART and other calculatorsREFRESH Note before signing   Patient's presentation is most consistent with acute complicated illness / injury requiring  diagnostic workup.  Medical Decision Making Amount and/or Complexity of Data Reviewed Labs: ordered. Radiology:  ordered.    HPI/ROS      See MDM section for pertinent HPI and ROS. A complete ROS was performed with pertinent positives/negatives noted above.   Past Medical History:  Diagnosis Date   Chlamydia infection affecting pregnancy 11/29/2018   Postural orthostatic tachycardia syndrome    Supervision of high risk pregnancy, antepartum 10/30/2018    Nursing Staff Provider Office Location  [facility] Dating  LMP c/w 18 wk sono Language   English Anatomy US  WNL Flu Vaccine  Declined-11/23/2018 Genetic Screen  NIPS:  Low risk female TDaP vaccine   Declined 03/08/19 Hgb A1C or  GTT Early: wnl @ 20 w Third trimester normal Rhogam     LAB RESULTS  Feeding Plan both Blood Type AB/Positive/-- (10/22 1106)  Contraception tubal Antibody Negative (10/22    Wolff-Parkinson-White (WPW) syndrome    s/p ablation in 2006    Past Surgical History:  Procedure Laterality Date   ABLATION  2006   for Wolf parkinson white disease   CESAREAN SECTION     CESAREAN SECTION WITH BILATERAL TUBAL LIGATION N/A 05/25/2019   Procedure: CESAREAN SECTION WITH SALPINGECTOMY;  Surgeon: Tereso Newcomer, MD;  Location: [facility] LD [facility];  Service: Obstetrics;  Laterality: N/A;      Physical Exam   Vitals:   09/20/22 1959 09/20/22 2045 09/20/22 2142 09/20/22 2232  BP: 126/82 121/85 (!) 134/90 128/81  Pulse: 73 82 85 80  Resp: 20 17 16 16   Temp: 98.3 F (36.8 C) 98.3 F (36.8 C) 97.6 F (36.4 C)   TempSrc: Oral Oral Oral   SpO2:  100% 100% 100%  Weight:      Height:        Physical Exam Vitals and nursing note reviewed.  Constitutional:      General: She is not in acute distress.    Appearance: She is well-developed.  HENT:     Head: Normocephalic and atraumatic.  Eyes:     Conjunctiva/sclera: Conjunctivae normal.  Cardiovascular:     Rate and Rhythm: Normal rate and regular rhythm.     Heart sounds: No murmur heard. Pulmonary:     Effort: Pulmonary effort is normal. No respiratory distress.     Breath sounds:  Normal breath sounds.  Chest:     Chest wall: Tenderness present.  Abdominal:     Palpations: Abdomen is soft.     Tenderness: There is no abdominal tenderness.  Musculoskeletal:        General: No swelling.     Cervical back: Neck supple.  Skin:    General: Skin is warm and dry.     Capillary Refill: Capillary refill takes less than 2 seconds.  Neurological:     Mental Status: She is alert.  Psychiatric:        Mood and Affect: Mood normal.    Starleen Arms, MD Department of Emergency Medicine   Please note that this documentation was produced with the assistance of voice-to-text technology and may contain errors.    Dyanne Iha, MD 09/21/22 [place] Abide    Lonell Grandchild, MD 09/21/22 [phone]

## 2022-09-20 NOTE — Discharge Instructions (Signed)
You were seen today for chest pain, shortness of breath, arm pain, loss of your voice, inability to open your right eye, seizures. While you were here we monitored your vitals, performed a physical exam, and checked blood work, an EKG, and imaging. These were all reassuring and there is no indication for any further testing or intervention in the emergency department at this time.   Things to do:  - Follow up with your primary care provider within the next 1-2 weeks -You may take ibuprofen and Tylenol in alternation as needed for pain control  Return to the emergency department if you have any new or worsening symptoms including worsening chest pain, fevers, shortness of breath, neurologic symptoms, inability to tolerate oral intake, or if you have any other concerns.

## 2022-09-20 NOTE — ED Triage Notes (Addendum)
Pt BIB [facility]. Pt started having sharp stabbing CP this AM with associated generalized weakness that has progressively worsened throughout the day. Pain is described bilateral. During transport pt started having numbness and tingling to bilateral hands. Pt states she has been under a lot of stress at work recently. Pt states she took 3 ASA PTA, but does not know the dose.

## 2022-09-20 NOTE — ED Provider Notes (Incomplete)
  [place] EMERGENCY DEPARTMENT AT [facility] Provider Note  MDM   HPI/ROS:  Desiree Pearson is a 37 y.o. female with medical history of POTS presenting with chief complaint of chest pain.  Patient claims she experienced a sharp, tingling and stabbing pain that started this morning.  She also experienced some associated shortness of breath, right arm pain, inability to open her right eye, seizures, and loss of her voice  Physical exam is notable for: -***  On my initial evaluation, patient is:  -Vital signs stable.*** Patient afebrile***, hemodynamically stable***, and non-toxic appearing.*** -Additional history obtained from ***  Given the patient's  Interpretations, interventions, and the patient's course of care are documented below.      ***   Disposition:  {ED Dispo:29898}  Clinical Impression: No diagnosis found.  Rx / DC Orders ED Discharge Orders     None       The plan for this patient was discussed with Dr. ***, who voiced agreement and who oversaw evaluation and treatment of this patient.   Clinical Complexity A medically appropriate history, review of systems, and physical exam was performed.  My independent interpretations of EKG, labs, and radiology are documented in the ED course above.   Click here for ABCD2, HEART and other calculatorsREFRESH Note before signing   Patient's presentation is most consistent with {EM COPA:27473}  Medical Decision Making Amount and/or Complexity of Data Reviewed Labs: ordered. Radiology: ordered.    HPI/ROS      See MDM section for pertinent HPI and ROS. A complete ROS was performed with pertinent positives/negatives noted above.   Past Medical History:  Diagnosis Date  . Chlamydia infection affecting pregnancy 11/29/2018  . Postural orthostatic tachycardia syndrome   . Supervision of high risk pregnancy, antepartum 10/30/2018    Nursing Staff Provider Office Location  [facility] Dating  LMP c/w 18 wk  sono Language   English Anatomy US  WNL Flu Vaccine  Declined-11/23/2018 Genetic Screen  NIPS:  Low risk female TDaP vaccine   Declined 03/08/19 Hgb A1C or  GTT Early: wnl @ 20 w Third trimester normal Rhogam     LAB RESULTS  Feeding Plan both Blood Type AB/Positive/-- (10/22 1106)  Contraception tubal Antibody Negative (10/22   . Wolff-Parkinson-White (WPW) syndrome    s/p ablation in 2006    Past Surgical History:  Procedure Laterality Date  . ABLATION  2006   for Wolf parkinson white disease  . CESAREAN SECTION    . CESAREAN SECTION WITH BILATERAL TUBAL LIGATION N/A 05/25/2019   Procedure: CESAREAN SECTION WITH SALPINGECTOMY;  Surgeon: Tereso Newcomer, MD;  Location: [facility] LD [facility];  Service: Obstetrics;  Laterality: N/A;      Physical Exam   Vitals:   09/20/22 1658 09/20/22 1659 09/20/22 1700 09/20/22 2045  BP:  (!) 157/102 (!) 135/99 121/85  Pulse:  84 82 82  Resp:  17  17  Temp:  98.1 F (36.7 C)  98.3 F (36.8 C)  TempSrc:    Oral  SpO2:  100% 100% 100%  Weight: 95.7 kg     Height: 5\' 2"  (1.575 m)       Physical Exam   Procedures    Procedures   Starleen Arms, MD Department of Emergency Medicine   Please note that this documentation was produced with the assistance of voice-to-text technology and may contain errors.

## 2022-09-20 NOTE — ED Notes (Signed)
Patient is resting in bed. Appears to be tired upon arousal. But able to answer questions and follow commands.

## 2022-09-24 ENCOUNTER — Ambulatory Visit: Admission: RE | Admit: 2022-09-24 | Payer: Medicaid Other | Source: Ambulatory Visit

## 2022-09-24 ENCOUNTER — Ambulatory Visit: Payer: Medicaid Other

## 2022-09-24 DIAGNOSIS — N644 Mastodynia: Secondary | ICD-10-CM
